# Patient Record
Sex: Male | Born: 1970
Health system: Southern US, Community
[De-identification: ages and names within clinical notes are randomized; demographics above are authoritative.]

## PROBLEM LIST (undated history)

## (undated) DIAGNOSIS — J45909 Unspecified asthma, uncomplicated: Secondary | ICD-10-CM

## (undated) DIAGNOSIS — R079 Chest pain, unspecified: Secondary | ICD-10-CM

## (undated) DIAGNOSIS — E785 Hyperlipidemia, unspecified: Secondary | ICD-10-CM

## (undated) DIAGNOSIS — I10 Essential (primary) hypertension: Secondary | ICD-10-CM

## (undated) HISTORY — PX: NO PAST SURGERIES: SHX2092

---

## 2017-11-26 DIAGNOSIS — R079 Chest pain, unspecified: Secondary | ICD-10-CM

## 2017-11-26 HISTORY — DX: Chest pain, unspecified: R07.9

## 2017-12-07 ENCOUNTER — Encounter (HOSPITAL_BASED_OUTPATIENT_CLINIC_OR_DEPARTMENT_OTHER): Payer: Self-pay | Admitting: Emergency Medicine

## 2017-12-07 ENCOUNTER — Ambulatory Visit (HOSPITAL_COMMUNITY)
Admission: EM | Disposition: A | Discharge status: Home / Self Care | Payer: Self-pay | Source: Home / Self Care | Attending: Emergency Medicine

## 2017-12-07 ENCOUNTER — Other Ambulatory Visit: Payer: Self-pay

## 2017-12-07 ENCOUNTER — Observation Stay (HOSPITAL_BASED_OUTPATIENT_CLINIC_OR_DEPARTMENT_OTHER)
Admission: EM | Admit: 2017-12-07 | Discharge: 2017-12-09 | Disposition: A | Discharge status: Home / Self Care | Payer: BLUE CROSS/BLUE SHIELD | Attending: Family Medicine | Admitting: Family Medicine

## 2017-12-07 ENCOUNTER — Emergency Department (HOSPITAL_BASED_OUTPATIENT_CLINIC_OR_DEPARTMENT_OTHER): Payer: BLUE CROSS/BLUE SHIELD

## 2017-12-07 DIAGNOSIS — Z23 Encounter for immunization: Secondary | ICD-10-CM | POA: Insufficient documentation

## 2017-12-07 DIAGNOSIS — R072 Precordial pain: Secondary | ICD-10-CM

## 2017-12-07 DIAGNOSIS — I5030 Unspecified diastolic (congestive) heart failure: Secondary | ICD-10-CM | POA: Diagnosis not present

## 2017-12-07 DIAGNOSIS — E785 Hyperlipidemia, unspecified: Secondary | ICD-10-CM | POA: Diagnosis not present

## 2017-12-07 DIAGNOSIS — Z6836 Body mass index (BMI) 36.0-36.9, adult: Secondary | ICD-10-CM | POA: Diagnosis not present

## 2017-12-07 DIAGNOSIS — I219 Acute myocardial infarction, unspecified: Secondary | ICD-10-CM | POA: Diagnosis not present

## 2017-12-07 DIAGNOSIS — R7303 Prediabetes: Secondary | ICD-10-CM | POA: Diagnosis not present

## 2017-12-07 DIAGNOSIS — I200 Unstable angina: Secondary | ICD-10-CM

## 2017-12-07 DIAGNOSIS — I420 Dilated cardiomyopathy: Secondary | ICD-10-CM | POA: Insufficient documentation

## 2017-12-07 DIAGNOSIS — R739 Hyperglycemia, unspecified: Secondary | ICD-10-CM | POA: Diagnosis not present

## 2017-12-07 DIAGNOSIS — Z8249 Family history of ischemic heart disease and other diseases of the circulatory system: Secondary | ICD-10-CM | POA: Diagnosis not present

## 2017-12-07 DIAGNOSIS — I10 Essential (primary) hypertension: Secondary | ICD-10-CM | POA: Diagnosis present

## 2017-12-07 DIAGNOSIS — E669 Obesity, unspecified: Secondary | ICD-10-CM | POA: Insufficient documentation

## 2017-12-07 DIAGNOSIS — I472 Ventricular tachycardia: Secondary | ICD-10-CM | POA: Diagnosis not present

## 2017-12-07 DIAGNOSIS — I214 Non-ST elevation (NSTEMI) myocardial infarction: Secondary | ICD-10-CM | POA: Diagnosis not present

## 2017-12-07 DIAGNOSIS — R079 Chest pain, unspecified: Secondary | ICD-10-CM | POA: Diagnosis present

## 2017-12-07 DIAGNOSIS — I2510 Atherosclerotic heart disease of native coronary artery without angina pectoris: Secondary | ICD-10-CM | POA: Diagnosis not present

## 2017-12-07 DIAGNOSIS — J45909 Unspecified asthma, uncomplicated: Secondary | ICD-10-CM | POA: Insufficient documentation

## 2017-12-07 DIAGNOSIS — Z79899 Other long term (current) drug therapy: Secondary | ICD-10-CM | POA: Insufficient documentation

## 2017-12-07 DIAGNOSIS — I110 Hypertensive heart disease with heart failure: Secondary | ICD-10-CM | POA: Diagnosis not present

## 2017-12-07 HISTORY — DX: Chest pain, unspecified: R07.9

## 2017-12-07 HISTORY — DX: Essential (primary) hypertension: I10

## 2017-12-07 HISTORY — PX: LEFT HEART CATH AND CORONARY ANGIOGRAPHY: CATH118249

## 2017-12-07 HISTORY — DX: Unspecified asthma, uncomplicated: J45.909

## 2017-12-07 HISTORY — DX: Hyperlipidemia, unspecified: E78.5

## 2017-12-07 LAB — CBC
HEMATOCRIT: 47.8 % (ref 39.0–52.0)
HEMOGLOBIN: 15.1 g/dL (ref 13.0–17.0)
MCH: 26.5 pg (ref 26.0–34.0)
MCHC: 31.6 g/dL (ref 30.0–36.0)
MCV: 84 fL (ref 78.0–100.0)
Platelets: 169 10*3/uL (ref 150–400)
RBC: 5.69 MIL/uL (ref 4.22–5.81)
RDW: 15.1 % (ref 11.5–15.5)
WBC: 5.9 10*3/uL (ref 4.0–10.5)

## 2017-12-07 LAB — CBC WITH DIFFERENTIAL/PLATELET
BASOS ABS: 0 10*3/uL (ref 0.0–0.1)
BASOS PCT: 0 %
EOS ABS: 0.1 10*3/uL (ref 0.0–0.7)
EOS PCT: 3 %
HCT: 47.2 % (ref 39.0–52.0)
Hemoglobin: 15.6 g/dL (ref 13.0–17.0)
LYMPHS ABS: 1.1 10*3/uL (ref 0.7–4.0)
Lymphocytes Relative: 28 %
MCH: 26.7 pg (ref 26.0–34.0)
MCHC: 33.1 g/dL (ref 30.0–36.0)
MCV: 80.8 fL (ref 78.0–100.0)
Monocytes Absolute: 0.2 10*3/uL (ref 0.1–1.0)
Monocytes Relative: 6 %
NEUTROS PCT: 63 %
Neutro Abs: 2.6 10*3/uL (ref 1.7–7.7)
PLATELETS: 157 10*3/uL (ref 150–400)
RBC: 5.84 MIL/uL — AB (ref 4.22–5.81)
RDW: 15.7 % — ABNORMAL HIGH (ref 11.5–15.5)
WBC: 4.1 10*3/uL (ref 4.0–10.5)

## 2017-12-07 LAB — CREATININE, SERUM
CREATININE: 1.25 mg/dL — AB (ref 0.61–1.24)
GFR calc Af Amer: >60 mL/min (ref >60)
GFR calc non Af Amer: >60 mL/min (ref >60)

## 2017-12-07 LAB — BASIC METABOLIC PANEL
Anion gap: 11 (ref 5–15)
BUN: 15 mg/dL (ref 6–20)
CO2: 25 mmol/L (ref 22–32)
Calcium: 9.6 mg/dL (ref 8.9–10.3)
Chloride: 105 mmol/L (ref 98–111)
Creatinine, Ser: 1.35 mg/dL — ABNORMAL HIGH (ref 0.61–1.24)
GFR calc non Af Amer: >60 mL/min (ref >60)
GLUCOSE: 160 mg/dL — AB (ref 70–99)
POTASSIUM: 4 mmol/L (ref 3.5–5.1)
SODIUM: 141 mmol/L (ref 135–145)

## 2017-12-07 LAB — TSH: TSH: 0.915 u[IU]/mL (ref 0.350–4.500)

## 2017-12-07 LAB — TROPONIN I
TROPONIN I: 0.79 ng/mL — AB (ref <0.03)
TROPONIN I: 6.38 ng/mL — AB (ref <0.03)
Troponin I: <0.03 ng/mL (ref <0.03)

## 2017-12-07 LAB — HEMOGLOBIN A1C
Hgb A1c MFr Bld: 6.4 % — ABNORMAL HIGH (ref 4.8–5.6)
Mean Plasma Glucose: 136.98 mg/dL

## 2017-12-07 SURGERY — LEFT HEART CATH AND CORONARY ANGIOGRAPHY
Anesthesia: LOCAL

## 2017-12-07 MED ORDER — ASPIRIN 81 MG PO CHEW
324.0000 mg | CHEWABLE_TABLET | Freq: Once | ORAL | Status: AC
  Administered 2017-12-07: 324 mg via ORAL
  Filled 2017-12-07: qty 4

## 2017-12-07 MED ORDER — ASPIRIN 81 MG PO CHEW: 81.0000 mg | CHEWABLE_TABLET | ORAL | Status: AC

## 2017-12-07 MED ORDER — HEPARIN (PORCINE) IN NACL 100-0.45 UNIT/ML-% IJ SOLN
1100.0000 [IU]/h | INTRAMUSCULAR | Status: DC
  Administered 2017-12-07: 1100 [IU]/h via INTRAVENOUS
  Filled 2017-12-07: qty 250

## 2017-12-07 MED ORDER — VERAPAMIL HCL 2.5 MG/ML IV SOLN
INTRAVENOUS | Status: DC | PRN
  Administered 2017-12-07: 10 mL via INTRA_ARTERIAL

## 2017-12-07 MED ORDER — SODIUM CHLORIDE 0.9% FLUSH
3.0000 mL | Freq: Two times a day (BID) | INTRAVENOUS | Status: DC
  Administered 2017-12-08 – 2017-12-09 (×2): 3 mL via INTRAVENOUS

## 2017-12-07 MED ORDER — SODIUM CHLORIDE 0.9% FLUSH: 3.0000 mL | INTRAVENOUS | Status: DC | PRN

## 2017-12-07 MED ORDER — METOPROLOL TARTRATE 25 MG PO TABS
25.0000 mg | ORAL_TABLET | Freq: Two times a day (BID) | ORAL | Status: DC
  Administered 2017-12-07: 25 mg via ORAL
  Filled 2017-12-07: qty 1

## 2017-12-07 MED ORDER — MIDAZOLAM HCL 2 MG/2ML IJ SOLN
INTRAMUSCULAR | Status: DC | PRN
  Administered 2017-12-07: 1 mg via INTRAVENOUS

## 2017-12-07 MED ORDER — PROPRANOLOL HCL 10 MG PO TABS
10.0000 mg | ORAL_TABLET | ORAL | Status: DC | PRN
  Filled 2017-12-07: qty 1

## 2017-12-07 MED ORDER — IOHEXOL 350 MG/ML SOLN
INTRAVENOUS | Status: DC | PRN
  Administered 2017-12-07: 80 mL via INTRA_ARTERIAL

## 2017-12-07 MED ORDER — SODIUM CHLORIDE 0.9% FLUSH
3.0000 mL | Freq: Two times a day (BID) | INTRAVENOUS | Status: DC
  Administered 2017-12-08: 3 mL via INTRAVENOUS

## 2017-12-07 MED ORDER — MIDAZOLAM HCL 2 MG/2ML IJ SOLN
INTRAMUSCULAR | Status: AC
  Filled 2017-12-07: qty 2

## 2017-12-07 MED ORDER — ATORVASTATIN CALCIUM 40 MG PO TABS
40.0000 mg | ORAL_TABLET | Freq: Every day | ORAL | Status: DC
  Administered 2017-12-07: 40 mg via ORAL
  Filled 2017-12-07: qty 1

## 2017-12-07 MED ORDER — FENTANYL CITRATE (PF) 100 MCG/2ML IJ SOLN
INTRAMUSCULAR | Status: DC | PRN
  Administered 2017-12-07: 50 ug via INTRAVENOUS

## 2017-12-07 MED ORDER — HEPARIN (PORCINE) IN NACL 1000-0.9 UT/500ML-% IV SOLN
INTRAVENOUS | Status: AC
  Filled 2017-12-07: qty 1000

## 2017-12-07 MED ORDER — LIDOCAINE HCL (PF) 1 % IJ SOLN
INTRAMUSCULAR | Status: DC | PRN
  Administered 2017-12-07: 2 mL

## 2017-12-07 MED ORDER — ONDANSETRON HCL 4 MG/2ML IJ SOLN: 4.0000 mg | Freq: Four times a day (QID) | INTRAMUSCULAR | Status: DC | PRN

## 2017-12-07 MED ORDER — MORPHINE SULFATE (PF) 2 MG/ML IV SOLN: 2.0000 mg | INTRAVENOUS | Status: DC | PRN

## 2017-12-07 MED ORDER — ACETAMINOPHEN 325 MG PO TABS
650.0000 mg | ORAL_TABLET | ORAL | Status: DC | PRN
  Administered 2017-12-07 – 2017-12-09 (×2): 650 mg via ORAL
  Filled 2017-12-07 (×2): qty 2

## 2017-12-07 MED ORDER — NITROGLYCERIN 0.4 MG SL SUBL
0.4000 mg | SUBLINGUAL_TABLET | SUBLINGUAL | Status: AC | PRN
  Administered 2017-12-07 (×3): 0.4 mg via SUBLINGUAL
  Filled 2017-12-07: qty 1

## 2017-12-07 MED ORDER — FENTANYL CITRATE (PF) 100 MCG/2ML IJ SOLN
INTRAMUSCULAR | Status: AC
  Filled 2017-12-07: qty 2

## 2017-12-07 MED ORDER — SODIUM CHLORIDE 0.9 % IV SOLN: 250.0000 mL | INTRAVENOUS | Status: DC | PRN

## 2017-12-07 MED ORDER — LISINOPRIL 40 MG PO TABS
40.0000 mg | ORAL_TABLET | Freq: Every day | ORAL | Status: DC
  Filled 2017-12-07: qty 1

## 2017-12-07 MED ORDER — SODIUM CHLORIDE 0.9 % IV SOLN: INTRAVENOUS | Status: DC

## 2017-12-07 MED ORDER — GI COCKTAIL ~~LOC~~: 30.0000 mL | Freq: Four times a day (QID) | ORAL | Status: DC | PRN

## 2017-12-07 MED ORDER — HEPARIN SODIUM (PORCINE) 5000 UNIT/ML IJ SOLN
5000.0000 [IU] | Freq: Three times a day (TID) | INTRAMUSCULAR | Status: DC
  Administered 2017-12-07 – 2017-12-09 (×5): 5000 [IU] via SUBCUTANEOUS
  Filled 2017-12-07 (×5): qty 1

## 2017-12-07 MED ORDER — HEPARIN (PORCINE) IN NACL 1000-0.9 UT/500ML-% IV SOLN
INTRAVENOUS | Status: DC | PRN
  Administered 2017-12-07 (×2): 500 mL

## 2017-12-07 MED ORDER — VERAPAMIL HCL 2.5 MG/ML IV SOLN
INTRAVENOUS | Status: AC
  Filled 2017-12-07: qty 2

## 2017-12-07 MED ORDER — LIDOCAINE HCL (PF) 1 % IJ SOLN
INTRAMUSCULAR | Status: AC
  Filled 2017-12-07: qty 30

## 2017-12-07 MED ORDER — ENOXAPARIN SODIUM 40 MG/0.4ML ~~LOC~~ SOLN
40.0000 mg | SUBCUTANEOUS | Status: DC
  Administered 2017-12-07: 40 mg via SUBCUTANEOUS
  Filled 2017-12-07: qty 0.4

## 2017-12-07 MED ORDER — SODIUM CHLORIDE 0.9 % IV SOLN
INTRAVENOUS | Status: AC | PRN
  Administered 2017-12-07: 75 mL/h via INTRAVENOUS

## 2017-12-07 MED ORDER — ASPIRIN EC 81 MG PO TBEC
81.0000 mg | DELAYED_RELEASE_TABLET | Freq: Every day | ORAL | Status: DC
  Administered 2017-12-07 – 2017-12-08 (×2): 81 mg via ORAL
  Filled 2017-12-07 (×2): qty 1

## 2017-12-07 MED ORDER — INFLUENZA VAC SPLIT QUAD 0.5 ML IM SUSY
0.5000 mL | PREFILLED_SYRINGE | INTRAMUSCULAR | Status: AC
  Administered 2017-12-09: 0.5 mL via INTRAMUSCULAR
  Filled 2017-12-07: qty 0.5

## 2017-12-07 MED ORDER — AMLODIPINE BESYLATE 10 MG PO TABS
10.0000 mg | ORAL_TABLET | Freq: Every day | ORAL | Status: DC
  Administered 2017-12-07 – 2017-12-09 (×3): 10 mg via ORAL
  Filled 2017-12-07 (×3): qty 1

## 2017-12-07 MED ORDER — HEPARIN SODIUM (PORCINE) 1000 UNIT/ML IJ SOLN
INTRAMUSCULAR | Status: AC
  Filled 2017-12-07: qty 1

## 2017-12-07 MED ORDER — FUROSEMIDE 10 MG/ML IJ SOLN
20.0000 mg | Freq: Every day | INTRAMUSCULAR | Status: DC
  Administered 2017-12-07 – 2017-12-08 (×2): 20 mg via INTRAVENOUS
  Filled 2017-12-07 (×2): qty 2

## 2017-12-07 SURGICAL SUPPLY — 10 items

## 2017-12-07 NOTE — ED Notes (Signed)
Report given to [facility] RN, accepting nurse at [facility]

## 2017-12-07 NOTE — Interval H&P Note (Signed)
History and Physical Interval Note:  12/07/2017 4:23 PM  Jeffrey Moreno  has presented today for cardiac catheterization, with the diagnosis of NSTEMI. The various methods of treatment have been discussed with the patient and family. After consideration of risks, benefits and other options for treatment, the patient has consented to  Procedure(s): LEFT HEART CATH AND CORONARY ANGIOGRAPHY (N/A) as a surgical intervention .  The patient's history has been reviewed, patient examined, no change in status, stable for surgery.  I have reviewed the patient's chart and labs.  Questions were answered to the patient's satisfaction.    Cath Lab Visit (complete for each Cath Lab visit)  Clinical Evaluation Leading to the Procedure:   ACS: Yes.    Non-ACS:  N/A  Jerik Falletta

## 2017-12-07 NOTE — ED Triage Notes (Signed)
Pt reports dull chest pain in center of chest that started at 0600 while eating breakfast. Pt also endorses shob.

## 2017-12-07 NOTE — Progress Notes (Signed)
ANTICOAGULATION CONSULT NOTE - Initial Consult  Pharmacy Consult for Heparin Indication: ACS/Chest pain  No Known Allergies  Patient Measurements: Height: 5\' 8"  (172.7 cm) Weight: 240 lb (108.9 kg) IBW/kg (Calculated) : 68.4 Heparin Dosing Weight: 92.5  Vital Signs: Temp: 99.1 F (37.3 C) (09/12 1400) Temp Source: Oral (09/12 1400) BP: 149/105 (09/12 1400) Pulse Rate: 77 (09/12 1400)  Labs: Recent Labs    12/07/17 0646 12/07/17 1216  HGB 15.6  --   HCT 47.2  --   PLT 157  --   CREATININE 1.35*  --   TROPONINI <0.03 0.79*    Estimated Creatinine Clearance: 80.1 mL/min (A) (by C-G formula based on SCr of 1.35 mg/dL (H)).   Medical History: Past Medical History:  Diagnosis Date  . Asthma   . Chest pain at rest 11/2017  . Hyperlipidemia   . Hypertension    Assessment: 56 y male with PMH of HTN, HLD, and obesity who presents with chest pain. Patient has exhibited a rise in troponin and therefore is being started on heparin for NSTEMI. Patient di receive one dose of enoxaparin 40mg  at ~1245 and therefore will not be bolused.   Goal of Therapy:  Heparin level 0.3-0.7 units/ml Monitor platelets by anticoagulation protocol: Yes   Plan:  Heparin drip 1100 units/hr Heparin level in 6 hours Daily Heparin level and CBC Monitor for s/sx of bleeding.   Loisann Roach A. Jeanella Craze, PharmD, BCPS Clinical Pharmacist [place] Pager: [phone] Please utilize Amion for appropriate phone number to reach the unit pharmacist [facility])   12/07/2017,3:00 PM

## 2017-12-07 NOTE — Progress Notes (Addendum)
Patient's troponin is positive at 0.79, up from <0.03.  This appears to be an NSTEMI.  Will start Heparin.  Cardiology has been consulted to see the patient today rather than waiting until tomorrow.  He did also have a 20-beat run of asymptomatic vtach while sleeping.  Georgana Curio, M.D.

## 2017-12-07 NOTE — Progress Notes (Signed)
Attending MD informed of positive troponin.

## 2017-12-07 NOTE — H&P (View-Only) (Signed)
Cardiology Consultation:   Patient ID: Jeffrey Moreno MRN: [id number]; DOB: 05-16-1970  Admit date: 12/07/2017 Date of Consult: 12/07/2017  Primary Care Provider: Deatra James, MD Primary Cardiologist: Jodelle Red, MD NEW Primary Electrophysiologist:  None    Patient Profile:   Jeffrey Moreno is a 47 y.o. male with a hx of asthma, HLD and HTN who is being seen today for the evaluation of chest pain at the request of Dr. Ophelia Charter.  History of Present Illness:   Jeffrey Moreno presented to ER at [facility] yesterday AM with chest pain pain began yesterday AM about 1 hour prior to presentation.  Started with eating BK and felt like a pressure in middle of his chest.  With exertion he was diaphoretic.  He had mild SOB he tried inhaler.  He was also dizzy.  In ER given NTG and it seemed to  Help- total of 3.    [facility] and now troponin elevated to 0.79 Iv Heparin was started.    EKG SR with ST abnormalities in Lat leads, no old to compare.  I personally reviewed Tele:  SR with NSVT up to 20 beats, I personally reviewed.    Troponin I <0.03  And now 0.79  Na 141, K+ 4.0, glucose 160, Cr 1.35 Hgb 15.6 plts 157 Hgb A1c 6.4  TSH 0.915  CXR heart borderline size no acute process.  Currently pain free, just tired.  He did have 19 beats of NSVT and then 6 more beats.  Pt was not aware.  RN stated he had pulse with the NSVT.   BP elevated 149/105 , temp 99.1  He just had lunch at 1230.   He had 40 mg lovenox at 1249 and now has order for IV heparin.    He has rec'd ASA 324, and 81 mg.  lipitor 40 mg and amlodipine 10 - his lisinopril on hold.  With mildly elevated Cr.  No prior hx of chest pain.    Past Medical History:  Diagnosis Date  . Asthma   . Chest pain at rest 11/2017  . Hyperlipidemia   . Hypertension     Past Surgical History:  Procedure Laterality Date  . NO PAST SURGERIES       Home Medications:  Prior to Admission medications     Medication Sig Start Date End Date Taking? Authorizing Provider  albuterol (PROVENTIL HFA;VENTOLIN HFA) 108 (90 Base) MCG/ACT inhaler Inhale 2 puffs into the lungs every 6 (six) hours as needed. 10/18/17  Yes [provider]  amLODipine (NORVASC) 10 MG tablet Take 10 mg by mouth daily.   Yes [provider]  atorvastatin (LIPITOR) 20 MG tablet Take 20 mg by mouth daily.   Yes [provider]  ibuprofen (ADVIL,MOTRIN) 200 MG tablet Take 600 mg by mouth as needed for moderate pain.   Yes [provider]  lisinopril (PRINIVIL,ZESTRIL) 40 MG tablet Take 40 mg by mouth daily.    Yes [provider]  propranolol (INDERAL) 10 MG tablet Take 10 mg by mouth as needed (anxiety).   Yes [provider]    Inpatient Medications: Scheduled Meds: . amLODipine  10 mg Oral Daily  . aspirin EC  81 mg Oral Daily  . atorvastatin  40 mg Oral Daily  . [START ON 12/08/2017] Influenza vac split quadrivalent PF  0.5 mL Intramuscular Tomorrow-1000  . lisinopril  40 mg Oral Daily   Continuous Infusions: . heparin     PRN  Meds: acetaminophen, gi cocktail, morphine injection, ondansetron (ZOFRAN) IV, propranolol  Allergies:   No Known Allergies  Social History:   Social History   Socioeconomic History  . Marital status: Single    Spouse name: Not on file  . Number of children: Not on file  . Years of education: Not on file  . Highest education level: Not on file  Occupational History  . Occupation: Environmental manager  Social Needs  . Financial resource strain: Not on file  . Food insecurity:    Worry: Not on file    Inability: Not on file  . Transportation needs:    Medical: Not on file    Non-medical: Not on file  Tobacco Use  . Smoking status: Never Smoker  . Smokeless tobacco: Never Used  Substance and Sexual Activity  . Alcohol use: Yes    Comment: SOCIAL  . Drug use: Never  . Sexual activity: Not on file  Lifestyle  . Physical activity:     Days per week: Not on file    Minutes per session: Not on file  . Stress: Not on file  Relationships  . Social connections:    Talks on phone: Not on file    Gets together: Not on file    Attends religious service: Not on file    Active member of club or organization: Not on file    Attends meetings of clubs or organizations: Not on file    Relationship status: Not on file  . Intimate partner violence:    Fear of current or ex partner: Not on file    Emotionally abused: Not on file    Physically abused: Not on file    Forced sexual activity: Not on file  Other Topics Concern  . Not on file  Social History Narrative  . Not on file    Family History:    Family History  Problem Relation Age of Onset  . Aneurysm Mother 20  . Hypertension Father   . CAD Maternal Grandmother      ROS:  Please see the history of present illness.  General:no colds or fevers, no weight changes Skin:no rashes or ulcers HEENT:no blurred vision, no congestion CV:see HPI PUL:see HPI, + asthma GI:no diarrhea constipation or melena, no indigestion GU:no hematuria, no dysuria MS:no joint pain, no claudication Neuro:no syncope, no lightheadedness Endo:no diabetes, no thyroid disease though borderline diabetes  All other ROS reviewed and negative.     Physical Exam/Data:   Vitals:   12/07/17 0900 12/07/17 0930 12/07/17 1100 12/07/17 1400  BP: (!) 154/110 121/87 (!) 165/108 (!) 149/105  Pulse: 70 85 74 77  Resp: (!) 26 (!) 24 18   Temp:   98.2 F (36.8 C) 99.1 F (37.3 C)  TempSrc:   Oral Oral  SpO2: 98% 100% 98% 100%  Weight:      Height:       No intake or output data in the 24 hours ending 12/07/17 1538 Filed Weights   12/07/17 0641  Weight: 108.9 kg   Body mass index is 36.49 kg/m.  General:  Well nourished, well developed, in no acute distress HEENT: normal Lymph: no adenopathy Neck: no JVD Endocrine:  No thryomegaly Vascular: No carotid bruits; pedal pulses 2+ bilaterally     Cardiac:  normal S1, S2; RRR; no murmur gallup rub or click Lungs:  clear to auscultation bilaterally, no wheezing, rhonchi or rales  Abd: soft, nontender, no hepatomegaly  Ext: no edema Musculoskeletal:  No  deformities, BUE and BLE strength normal and equal Skin: warm and dry  Neuro:  Alert and oriented X 3 MAE follows commands, no focal abnormalities noted Psych:  Normal affect     Relevant CV Studies: none  Laboratory Data:  Chemistry Recent Labs  Lab 12/07/17 0646  NA 141  K 4.0  CL 105  CO2 25  GLUCOSE 160*  BUN 15  CREATININE 1.35*  CALCIUM 9.6  GFRNONAA >60  GFRAA >60  ANIONGAP 11    No results for input(s): PROT, ALBUMIN, AST, ALT, ALKPHOS, BILITOT in the last 168 hours. Hematology Recent Labs  Lab 12/07/17 0646  WBC 4.1  RBC 5.84*  HGB 15.6  HCT 47.2  MCV 80.8  MCH 26.7  MCHC 33.1  RDW 15.7*  PLT 157   Cardiac Enzymes Recent Labs  Lab 12/07/17 0646 12/07/17 1216  TROPONINI <0.03 0.79*   No results for input(s): TROPIPOC in the last 168 hours.  BNPNo results for input(s): BNP, PROBNP in the last 168 hours.  DDimer No results for input(s): DDIMER in the last 168 hours.  Radiology/Studies:  Dg Chest 2 View  Result Date: 12/07/2017 CLINICAL DATA:  Dull chest pain. EXAM: CHEST - 2 VIEW COMPARISON:  None. FINDINGS: Heart is borderline in size. Lungs clear. No effusions. No acute bony abnormality. IMPRESSION: Borderline heart size.  No active disease. Electronically Signed   By: Charlett Nose M.D.   On: 12/07/2017 07:29    Assessment and Plan:   1. NSTEMI with chest pain, elevated troponin, abnormal EKG and NSVT currently pain free, IV heparin to be added.  Dr. Cristal Deer to see.  Probable cath in AM though may be able to arrange for today.  Will add BB but monitor for wheezes with hx of asthma.  Hold lisinopril for cardiac cath.   Pt with HTN , HLD and borderline diabetes.  We can do heart cath today so will plan for this.   2. Asthma monitor  with BB 3. HTN uncontrolled currently will add hydralazine if Dr. Cristal Deer agrees.  4. HLD on Lipitor at home will increase dose 80 mg     5. Borderline diabetic.       For questions or updates, please contact [facility] Please consult [email or web address] for contact info under     Signed, Nada Boozer, NP  12/07/2017 3:38 PM

## 2017-12-07 NOTE — Progress Notes (Signed)
Called with a request to transfer this patient from [facility] to [facility].  Patient is a 48yo male with h/o HTN, HLD, obesity, and asthma presenting with chest pain starting at 0600.  +diaphoresis, dizziness.  EKG with fascicular block, no ST elevations.  Troponin negative at 1 hour.  Given concern for high-risk chest pain, would like to observe and r/o for MI.  Will place an order for Obs/Tele.  Georgana Curio, M.D.

## 2017-12-07 NOTE — ED Provider Notes (Signed)
[facility] EMERGENCY DEPARTMENT Provider Note   CSN: [id number] Arrival date & time: 12/07/17  7356     History   Chief Complaint Chief Complaint  Patient presents with  . Chest Pain    HPI Jeffrey Moreno is a 47 y.o. male.  HPI  47 year old male with a history of asthma, hypertension, hypercholesterolemia presents with chest pain.  Started around 6 AM while he was eating breakfast.  Feels like a pressure in the middle of his chest.  Does not radiate.  He got up to go to the bedroom after a couple minutes and broke out into a sweat.  Eventually in his bedroom he started to have dizziness.  Felt like the room was spinning.  There was also some shortness of breath and he tried albuterol because he thought it might be asthma.  No back pain or abdominal pain.  No nausea/vomiting.  Pain was originally around a 7 out of 10 but now is about a 3 out of 10.  No leg swelling/pain.  No smoking.  Past Medical History:  Diagnosis Date  . Asthma   . Hypertension     Patient Active Problem List   Diagnosis Date Noted  . Chest pain 12/07/2017    History reviewed. No pertinent surgical history.      Home Medications    Prior to Admission medications   Medication Sig Start Date End Date Taking? Authorizing Provider  amLODipine (NORVASC) 10 MG tablet Take 10 mg by mouth daily.   Yes [provider]  atorvastatin (LIPITOR) 20 MG tablet Take 20 mg by mouth daily.   Yes [provider]  lisinopril (PRINIVIL,ZESTRIL) 20 MG tablet Take 40 mg by mouth daily.    Yes [provider]  propranolol (INDERAL) 10 MG tablet Take 10 mg by mouth as needed (anxiety).   Yes [provider]    Family History No family history on file.  Social History Social History   Tobacco Use  . Smoking status: Never Smoker  . Smokeless tobacco: Never Used  Substance Use Topics  . Alcohol use: Yes  . Drug use: Never     Allergies   Patient has no known  allergies.   Review of Systems Review of Systems  Constitutional: Positive for diaphoresis.  Respiratory: Positive for shortness of breath.   Cardiovascular: Positive for chest pain. Negative for leg swelling.  Gastrointestinal: Negative for abdominal pain, nausea and vomiting.  All other systems reviewed and are negative.    Physical Exam Updated Vital Signs BP (!) 147/112   Pulse 89   Temp 97.7 F (36.5 C) (Oral)   Resp (!) 21   Ht 5\' 8"  (1.727 m)   Wt 108.9 kg   SpO2 96%   BMI 36.49 kg/m   Physical Exam  Constitutional: He is oriented to person, place, and time. He appears well-developed and well-nourished.  Non-toxic appearance. He does not appear ill. No distress.  HENT:  Head: Normocephalic and atraumatic.  Right Ear: External ear normal.  Left Ear: External ear normal.  Nose: Nose normal.  Eyes: Right eye exhibits no discharge. Left eye exhibits no discharge.  Neck: Neck supple.  Cardiovascular: Normal rate, regular rhythm and normal heart sounds.  Pulses:      Radial pulses are 2+ on the right side, and 2+ on the left side.  Pulmonary/Chest: Effort normal and breath sounds normal.  Abdominal: Soft. There is no tenderness.  Musculoskeletal: He exhibits no edema.  Neurological: He  is alert and oriented to person, place, and time.  Skin: Skin is warm and dry. He is not diaphoretic.  Nursing note and vitals reviewed.    ED Treatments / Results  Labs (all labs ordered are listed, but only abnormal results are displayed) Labs Reviewed  CBC WITH DIFFERENTIAL/PLATELET - Abnormal; Notable for the following components:      Result Value   RBC 5.84 (*)    RDW 15.7 (*)    All other components within normal limits  BASIC METABOLIC PANEL - Abnormal; Notable for the following components:   Glucose, Bld 160 (*)    Creatinine, Ser 1.35 (*)    All other components within normal limits  TROPONIN I    EKG EKG Interpretation  Date/Time:  Thursday December 07 2017  06:39:10 EDT Ventricular Rate:  93 PR Interval:    QRS Duration: 83 QT Interval:  338 QTC Calculation: 421 R Axis:   -70 Text Interpretation:  Sinus rhythm Probable left atrial enlargement LAD, consider left anterior fascicular block Confirmed by Palumbo, April ([id number]) on 12/07/2017 6:43:23 AM   EKG Interpretation  Date/Time:  Thursday December 07 2017 07:56:40 EDT Ventricular Rate:  95 PR Interval:    QRS Duration: 82 QT Interval:  352 QTC Calculation: 443 R Axis:   -51 Text Interpretation:  Sinus rhythm Probable left atrial enlargement Left axis deviation Nonspecific T abnormalities, lateral leads no significant change since earlier in the day Confirmed by Pricilla Loveless [phone]) on 12/07/2017 8:02:03 AM        Radiology Dg Chest 2 View  Result Date: 12/07/2017 CLINICAL DATA:  Dull chest pain. EXAM: CHEST - 2 VIEW COMPARISON:  None. FINDINGS: Heart is borderline in size. Lungs clear. No effusions. No acute bony abnormality. IMPRESSION: Borderline heart size.  No active disease. Electronically Signed   By: Charlett Nose M.D.   On: 12/07/2017 07:29    Procedures Procedures (including critical care time)  Medications Ordered in ED Medications  aspirin chewable tablet 324 mg (324 mg Oral Given 12/07/17 0718)  nitroGLYCERIN (NITROSTAT) SL tablet 0.4 mg (0.4 mg Sublingual Given 12/07/17 0733)     Initial Impression / Assessment and Plan / ED Course  I have reviewed the triage vital signs and the nursing notes.  Pertinent labs & imaging results that were available during my care of the patient were reviewed by me and considered in my medical decision making (see chart for details).     Patient's chest pain is concerning and he has some risk factors for cardiac disease.  His HEART score is a 4.  He has nonspecific T wave changes but no old to compare to on his ECG.  He does have good relief with nitroglycerin and aspirin.  At this point, I think he will need close work-up and thus  I will admit him to the hospital service.  Dr. Ophelia Charter accepts and transfer and admission to [facility].  Final Clinical Impressions(s) / ED Diagnoses   Final diagnoses:  Chest pain at rest    ED Discharge Orders    None       Pricilla Loveless, MD 12/07/17 [phone]

## 2017-12-07 NOTE — Consult Note (Signed)
Cardiology Consultation:   Patient ID: Jeffrey Moreno MRN: [id number]; DOB: 08/28/1970  Admit date: 12/07/2017 Date of Consult: 12/07/2017  Primary Care Provider: Sun, Vyvyan, MD Primary Cardiologist: Bridgette Christopher, MD NEW Primary Electrophysiologist:  None    Patient Profile:   Jeffrey Moreno is a 47 y.o. male with a hx of asthma, HLD and HTN who is being seen today for the evaluation of chest pain at the request of Dr. Yates.  History of Present Illness:   Jeffrey Moreno presented to ER at [facility] yesterday AM with chest pain pain began yesterday AM about 1 hour prior to presentation.  Started with eating BK and felt like a pressure in middle of his chest.  With exertion he was diaphoretic.  He had mild SOB he tried inhaler.  He was also dizzy.  In ER given NTG and it seemed to  Help- total of 3.    [facility] and now troponin elevated to 0.79 Iv Heparin was started.    EKG SR with ST abnormalities in Lat leads, no old to compare.  I personally reviewed Tele:  SR with NSVT up to 20 beats, I personally reviewed.    Troponin I <0.03  And now 0.79  Na 141, K+ 4.0, glucose 160, Cr 1.35 Hgb 15.6 plts 157 Hgb A1c 6.4  TSH 0.915  CXR heart borderline size no acute process.  Currently pain free, just tired.  He did have 19 beats of NSVT and then 6 more beats.  Pt was not aware.  RN stated he had pulse with the NSVT.   BP elevated 149/105 , temp 99.1  He just had lunch at 1230.   He had 40 mg lovenox at 1249 and now has order for IV heparin.    He has rec'd ASA 324, and 81 mg.  lipitor 40 mg and amlodipine 10 - his lisinopril on hold.  With mildly elevated Cr.  No prior hx of chest pain.    Past Medical History:  Diagnosis Date  . Asthma   . Chest pain at rest 11/2017  . Hyperlipidemia   . Hypertension     Past Surgical History:  Procedure Laterality Date  . NO PAST SURGERIES       Home Medications:  Prior to Admission medications     Medication Sig Start Date End Date Taking? Authorizing Provider  albuterol (PROVENTIL HFA;VENTOLIN HFA) 108 (90 Base) MCG/ACT inhaler Inhale 2 puffs into the lungs every 6 (six) hours as needed. 10/18/17  Yes [provider]  amLODipine (NORVASC) 10 MG tablet Take 10 mg by mouth daily.   Yes [provider]  atorvastatin (LIPITOR) 20 MG tablet Take 20 mg by mouth daily.   Yes [provider]  ibuprofen (ADVIL,MOTRIN) 200 MG tablet Take 600 mg by mouth as needed for moderate pain.   Yes [provider]  lisinopril (PRINIVIL,ZESTRIL) 40 MG tablet Take 40 mg by mouth daily.    Yes [provider]  propranolol (INDERAL) 10 MG tablet Take 10 mg by mouth as needed (anxiety).   Yes [provider]    Inpatient Medications: Scheduled Meds: . amLODipine  10 mg Oral Daily  . aspirin EC  81 mg Oral Daily  . atorvastatin  40 mg Oral Daily  . [START ON 12/08/2017] Influenza vac split quadrivalent PF  0.5 mL Intramuscular Tomorrow-1000  . lisinopril  40 mg Oral Daily   Continuous Infusions: . heparin     PRN   Meds: acetaminophen, gi cocktail, morphine injection, ondansetron (ZOFRAN) IV, propranolol  Allergies:   No Known Allergies  Social History:   Social History   Socioeconomic History  . Marital status: Single    Spouse name: Not on file  . Number of children: Not on file  . Years of education: Not on file  . Highest education level: Not on file  Occupational History  . Occupation: photographer  Social Needs  . Financial resource strain: Not on file  . Food insecurity:    Worry: Not on file    Inability: Not on file  . Transportation needs:    Medical: Not on file    Non-medical: Not on file  Tobacco Use  . Smoking status: Never Smoker  . Smokeless tobacco: Never Used  Substance and Sexual Activity  . Alcohol use: Yes    Comment: SOCIAL  . Drug use: Never  . Sexual activity: Not on file  Lifestyle  . Physical activity:     Days per week: Not on file    Minutes per session: Not on file  . Stress: Not on file  Relationships  . Social connections:    Talks on phone: Not on file    Gets together: Not on file    Attends religious service: Not on file    Active member of club or organization: Not on file    Attends meetings of clubs or organizations: Not on file    Relationship status: Not on file  . Intimate partner violence:    Fear of current or ex partner: Not on file    Emotionally abused: Not on file    Physically abused: Not on file    Forced sexual activity: Not on file  Other Topics Concern  . Not on file  Social History Narrative  . Not on file    Family History:    Family History  Problem Relation Age of Onset  . Aneurysm Mother 60  . Hypertension Father   . CAD Maternal Grandmother      ROS:  Please see the history of present illness.  General:no colds or fevers, no weight changes Skin:no rashes or ulcers HEENT:no blurred vision, no congestion CV:see HPI PUL:see HPI, + asthma GI:no diarrhea constipation or melena, no indigestion GU:no hematuria, no dysuria MS:no joint pain, no claudication Neuro:no syncope, no lightheadedness Endo:no diabetes, no thyroid disease though borderline diabetes  All other ROS reviewed and negative.     Physical Exam/Data:   Vitals:   12/07/17 0900 12/07/17 0930 12/07/17 1100 12/07/17 1400  BP: (!) 154/110 121/87 (!) 165/108 (!) 149/105  Pulse: 70 85 74 77  Resp: (!) 26 (!) 24 18   Temp:   98.2 F (36.8 C) 99.1 F (37.3 C)  TempSrc:   Oral Oral  SpO2: 98% 100% 98% 100%  Weight:      Height:       No intake or output data in the 24 hours ending 12/07/17 1538 Filed Weights   12/07/17 0641  Weight: 108.9 kg   Body mass index is 36.49 kg/m.  General:  Well nourished, well developed, in no acute distress HEENT: normal Lymph: no adenopathy Neck: no JVD Endocrine:  No thryomegaly Vascular: No carotid bruits; pedal pulses 2+ bilaterally     Cardiac:  normal S1, S2; RRR; no murmur gallup rub or click Lungs:  clear to auscultation bilaterally, no wheezing, rhonchi or rales  Abd: soft, nontender, no hepatomegaly  Ext: no edema Musculoskeletal:  No   deformities, BUE and BLE strength normal and equal Skin: warm and dry  Neuro:  Alert and oriented X 3 MAE follows commands, no focal abnormalities noted Psych:  Normal affect     Relevant CV Studies: none  Laboratory Data:  Chemistry Recent Labs  Lab 12/07/17 0646  NA 141  K 4.0  CL 105  CO2 25  GLUCOSE 160*  BUN 15  CREATININE 1.35*  CALCIUM 9.6  GFRNONAA >60  GFRAA >60  ANIONGAP 11    No results for input(s): PROT, ALBUMIN, AST, ALT, ALKPHOS, BILITOT in the last 168 hours. Hematology Recent Labs  Lab 12/07/17 0646  WBC 4.1  RBC 5.84*  HGB 15.6  HCT 47.2  MCV 80.8  MCH 26.7  MCHC 33.1  RDW 15.7*  PLT 157   Cardiac Enzymes Recent Labs  Lab 12/07/17 0646 12/07/17 1216  TROPONINI <0.03 0.79*   No results for input(s): TROPIPOC in the last 168 hours.  BNPNo results for input(s): BNP, PROBNP in the last 168 hours.  DDimer No results for input(s): DDIMER in the last 168 hours.  Radiology/Studies:  Dg Chest 2 View  Result Date: 12/07/2017 CLINICAL DATA:  Dull chest pain. EXAM: CHEST - 2 VIEW COMPARISON:  None. FINDINGS: Heart is borderline in size. Lungs clear. No effusions. No acute bony abnormality. IMPRESSION: Borderline heart size.  No active disease. Electronically Signed   By: Kevin  Dover M.D.   On: 12/07/2017 07:29    Assessment and Plan:   1. NSTEMI with chest pain, elevated troponin, abnormal EKG and NSVT currently pain free, IV heparin to be added.  Dr. Christopher to see.  Probable cath in AM though may be able to arrange for today.  Will add BB but monitor for wheezes with hx of asthma.  Hold lisinopril for cardiac cath.   Pt with HTN , HLD and borderline diabetes.  We can do heart cath today so will plan for this.   2. Asthma monitor  with BB 3. HTN uncontrolled currently will add hydralazine if Dr. Christopher agrees.  4. HLD on Lipitor at home will increase dose 80 mg     5. Borderline diabetic.       For questions or updates, please contact [facility] Please consult [email or web address] for contact info under     Signed, Wolf Boulay, NP  12/07/2017 3:38 PM  

## 2017-12-07 NOTE — H&P (Signed)
History and Physical    Jeffrey Moreno ZOX:[id number] DOB: 12-Aug-1970 DOA: 12/07/2017  PCP: Deatra James, MD Consultants:  None Patient coming from: Home - lives with Daughter; NOKDorcas Mcmurray, [phone]  Chief Complaint: chest pain  HPI: Jeffrey Moreno is a 47 y.o. male with medical history significant of HTN; HLD; obesity; and asthma presenting with chest pain.   This morning, he was eating breakfast (waffle, milk).  He developed chest pain.  He laid down and it got worse.  He felt dizzy and started sweating with clammy hands.  He took a shower and continued to feel bad and then came to the ER.  Denies h/o chest pain.  No prior stress test.  Symptoms resolved completely with NTG.  He is not having any pain at this time.  Substernal chest pain, but not exertional.  He was transferred from [facility] for further evaluation.   ED Course:  Chest pain starting at 0600.  +diaphoresis, dizziness.  EKG with fascicular block, no ST elevations.  Troponin negative at 1 hour.  Given concern for high-risk chest pain, would like to observe and r/o for MI.    Review of Systems: As per HPI; otherwise review of systems reviewed and negative.   Ambulatory Status:  Ambulates without assistance  Past Medical History:  Diagnosis Date  . Asthma   . Chest pain at rest 11/2017  . Hyperlipidemia   . Hypertension     Past Surgical History:  Procedure Laterality Date  . NO PAST SURGERIES      Social History   Socioeconomic History  . Marital status: Single    Spouse name: Not on file  . Number of children: Not on file  . Years of education: Not on file  . Highest education level: Not on file  Occupational History  . Occupation: Environmental manager  Social Needs  . Financial resource strain: Not on file  . Food insecurity:    Worry: Not on file    Inability: Not on file  . Transportation needs:    Medical: Not on file    Non-medical: Not on file  Tobacco Use  . Smoking status: Never Smoker  . Smokeless  tobacco: Never Used  Substance and Sexual Activity  . Alcohol use: Yes    Comment: SOCIAL  . Drug use: Never  . Sexual activity: Not on file  Lifestyle  . Physical activity:    Days per week: Not on file    Minutes per session: Not on file  . Stress: Not on file  Relationships  . Social connections:    Talks on phone: Not on file    Gets together: Not on file    Attends religious service: Not on file    Active member of club or organization: Not on file    Attends meetings of clubs or organizations: Not on file    Relationship status: Not on file  . Intimate partner violence:    Fear of current or ex partner: Not on file    Emotionally abused: Not on file    Physically abused: Not on file    Forced sexual activity: Not on file  Other Topics Concern  . Not on file  Social History Narrative  . Not on file    No Known Allergies  Family History  Problem Relation Age of Onset  . Aneurysm Mother 70  . Hypertension Father   . CAD Maternal Grandmother     Prior to Admission medications   Medication  Sig Start Date End Date Taking? Authorizing Provider  amLODipine (NORVASC) 10 MG tablet Take 10 mg by mouth daily.   Yes [provider]  atorvastatin (LIPITOR) 20 MG tablet Take 20 mg by mouth daily.   Yes [provider]  lisinopril (PRINIVIL,ZESTRIL) 20 MG tablet Take 40 mg by mouth daily.    Yes [provider]  propranolol (INDERAL) 10 MG tablet Take 10 mg by mouth as needed (anxiety).   Yes [provider]    Physical Exam: Vitals:   12/07/17 0830 12/07/17 0900 12/07/17 0930 12/07/17 1100  BP: (!) 152/103 (!) 154/110 121/87 (!) 165/108  Pulse: 71 70 85 74  Resp: (!) 24 (!) 26 (!) 24 18  Temp:    98.2 F (36.8 C)  TempSrc:    Oral  SpO2: 97% 98% 100% 98%  Weight:      Height:         General:  Appears calm and comfortable and is NAD Eyes:  PERRL, EOMI, normal lids, iris ENT:  grossly normal hearing, lips & tongue, mmm;  appropriate dentition Neck:  no LAD, masses or thyromegaly; no carotid bruits Cardiovascular:  RRR, no m/r/g. No LE edema.  Respiratory:   CTA bilaterally with no wheezes/rales/rhonchi.  Normal respiratory effort. Abdomen:  soft, NT, ND, NABS Back:   normal alignment, no CVAT Skin:  no rash or induration seen on limited exam Musculoskeletal:  grossly normal tone BUE/BLE, good ROM, no bony abnormality Psychiatric:  grossly normal mood and affect, speech fluent and appropriate, AOx3 Neurologic:  CN 2-12 grossly intact, moves all extremities in coordinated fashion, sensation intact    Radiological Exams on Admission: Dg Chest 2 View  Result Date: 12/07/2017 CLINICAL DATA:  Dull chest pain. EXAM: CHEST - 2 VIEW COMPARISON:  None. FINDINGS: Heart is borderline in size. Lungs clear. No effusions. No acute bony abnormality. IMPRESSION: Borderline heart size.  No active disease. Electronically Signed   By: Charlett Nose M.D.   On: 12/07/2017 07:29    EKG: Independently reviewed.  NSR with rate 95; nonspecific ST changes with no evidence of acute ischemia   Labs on Admission: I have personally reviewed the available labs and imaging studies at the time of the admission.  Pertinent labs:   Glucose 160 BUN 15/Creatinine 1.35/GFR >60 Troponin <0.03 Unremarkable CBC   Assessment/Plan Principal Problem:   Chest pain Active Problems:   Essential hypertension   Dyslipidemia   Hyperglycemia   Chest pain -Patient with substernal chest pain that began spontaneously this AM and resolved with NTG. -2/3 typical symptoms suggestive of atypical cardiac chest pain.  -CXR unremarkable.   -Initial cardiac troponin negative.  -EKG not indicative of acute ischemia.   -Heart pathway score is 6, with a >1% risk of MACE within 30 days.  -Will plan to place in observation status on telemetry to rule out ACS by overnight observation.  -cycle troponin q6h x 3 and repeat EKG in AM -Start ASA 81 mg   daily -morphine given -Risk factor stratification with HgbA1c and FLP; will also check TSH and UDS -Cardiology consultation in AM - NPO for possible stress test  -Will plan to start Heparin drip if enzymes are positive and/or chest pain recurs  HTN -Takes Norvasc, Propranolol, and Lisinopril at home -Continue home medications for now  HLD -Continue Lipitor, but increase from 20 to 40 mg daily for now -Check fasting lipids in the AM  Hypergylcemia -Glucose 160 -No known h/o DM -Will check A1c -  Will follow without insulin for now  DVT prophylaxis:   Lovenox Code Status:  Full - confirmed with patient Family Communication: None present (patient was on the phone with his father and so it is likely that his father was able to hear the entire encounter, but did not contribute) Disposition Plan:  Home once clinically improved Consults called: Cardiology to see in AM; inbox message sent to [employer]  Admission status: It is my clinical opinion that referral for OBSERVATION is reasonable and necessary in this patient based on the above information provided. The aforementioned taken together are felt to place the patient at high risk for further clinical deterioration. However it is anticipated that the patient may be medically stable for discharge from the hospital within 24 to 48 hours.    Jonah Blue MD [facility]  If note is complete, please contact covering daytime or nighttime physician. [email or web address] Password [email or web address]  12/07/2017, 1:19 PM

## 2017-12-08 ENCOUNTER — Observation Stay (HOSPITAL_BASED_OUTPATIENT_CLINIC_OR_DEPARTMENT_OTHER): Payer: BLUE CROSS/BLUE SHIELD

## 2017-12-08 ENCOUNTER — Encounter (HOSPITAL_COMMUNITY): Payer: Self-pay | Admitting: Internal Medicine

## 2017-12-08 DIAGNOSIS — I259 Chronic ischemic heart disease, unspecified: Secondary | ICD-10-CM

## 2017-12-08 DIAGNOSIS — I214 Non-ST elevation (NSTEMI) myocardial infarction: Secondary | ICD-10-CM | POA: Diagnosis not present

## 2017-12-08 DIAGNOSIS — E785 Hyperlipidemia, unspecified: Secondary | ICD-10-CM

## 2017-12-08 DIAGNOSIS — I10 Essential (primary) hypertension: Secondary | ICD-10-CM

## 2017-12-08 DIAGNOSIS — I2510 Atherosclerotic heart disease of native coronary artery without angina pectoris: Secondary | ICD-10-CM | POA: Diagnosis not present

## 2017-12-08 DIAGNOSIS — Z23 Encounter for immunization: Secondary | ICD-10-CM | POA: Diagnosis not present

## 2017-12-08 DIAGNOSIS — I5030 Unspecified diastolic (congestive) heart failure: Secondary | ICD-10-CM | POA: Diagnosis not present

## 2017-12-08 DIAGNOSIS — I472 Ventricular tachycardia: Secondary | ICD-10-CM | POA: Diagnosis not present

## 2017-12-08 DIAGNOSIS — R739 Hyperglycemia, unspecified: Secondary | ICD-10-CM | POA: Diagnosis not present

## 2017-12-08 DIAGNOSIS — I428 Other cardiomyopathies: Secondary | ICD-10-CM

## 2017-12-08 LAB — CBC
HCT: 49 % (ref 39.0–52.0)
Hemoglobin: 15.2 g/dL (ref 13.0–17.0)
MCH: 26 pg (ref 26.0–34.0)
MCHC: 31 g/dL (ref 30.0–36.0)
MCV: 83.9 fL (ref 78.0–100.0)
PLATELETS: 176 10*3/uL (ref 150–400)
RBC: 5.84 MIL/uL — ABNORMAL HIGH (ref 4.22–5.81)
RDW: 15.7 % — ABNORMAL HIGH (ref 11.5–15.5)
WBC: 5.7 10*3/uL (ref 4.0–10.5)

## 2017-12-08 LAB — LIPID PANEL
CHOLESTEROL: 244 mg/dL — AB (ref 0–200)
HDL: 42 mg/dL (ref >40)
LDL Cholesterol: 171 mg/dL — ABNORMAL HIGH (ref 0–99)
TRIGLYCERIDES: 155 mg/dL — AB (ref <150)
Total CHOL/HDL Ratio: 5.8 RATIO
VLDL: 31 mg/dL (ref 0–40)

## 2017-12-08 LAB — ECHOCARDIOGRAM COMPLETE
HEIGHTINCHES: 68 in
WEIGHTICAEL: 3857.6 [oz_av]

## 2017-12-08 LAB — C-REACTIVE PROTEIN

## 2017-12-08 LAB — TROPONIN I: Troponin I: 6.13 ng/mL (ref <0.03)

## 2017-12-08 LAB — HIV ANTIBODY (ROUTINE TESTING W REFLEX): HIV Screen 4th Generation wRfx: NONREACTIVE

## 2017-12-08 LAB — SEDIMENTATION RATE: SED RATE: 1 mm/h (ref 0–16)

## 2017-12-08 MED ORDER — ATORVASTATIN CALCIUM 80 MG PO TABS
80.0000 mg | ORAL_TABLET | Freq: Every day | ORAL | Status: DC
  Administered 2017-12-08 – 2017-12-09 (×2): 80 mg via ORAL
  Filled 2017-12-08 (×2): qty 1

## 2017-12-08 MED ORDER — LISINOPRIL 40 MG PO TABS
40.0000 mg | ORAL_TABLET | Freq: Every day | ORAL | Status: DC
  Administered 2017-12-08 – 2017-12-09 (×2): 40 mg via ORAL
  Filled 2017-12-08 (×2): qty 1

## 2017-12-08 MED ORDER — CARVEDILOL 6.25 MG PO TABS
6.2500 mg | ORAL_TABLET | Freq: Two times a day (BID) | ORAL | Status: DC
  Administered 2017-12-08 – 2017-12-09 (×3): 6.25 mg via ORAL
  Filled 2017-12-08 (×3): qty 1

## 2017-12-08 MED FILL — Heparin Sodium (Porcine) Inj 1000 Unit/ML: INTRAMUSCULAR | Qty: 10 | Status: AC

## 2017-12-08 NOTE — Progress Notes (Signed)
CCMD called to notify pt had a 9 beat run of Vtach. This RN was in room when occurred. Pt was sleeping and asymptomatic. Will continue to monitor.

## 2017-12-08 NOTE — Progress Notes (Signed)
Progress Note  Patient Name: Jeffrey Moreno Date of Encounter: 12/08/2017  Primary Cardiologist: Jodelle Red, MD   Subjective   Patient doing well today. No chest pain. One brief episode of NSVT overnight, did not notice. Troponin is trending up, but he feels fine.  Reviewed in depth history again with him. No recent illnesses, sick contacts, recent travel. Has had a lot of personal and work related stress in his life, some very major. Has had issues with elevated blood pressure; medications are more recent, but has had elevated readings for at least the last several years. No family history of sudden cardiac death. Only heart failure is in an elderly relative.  Inpatient Medications    Scheduled Meds: . amLODipine  10 mg Oral Daily  . aspirin  81 mg Oral Pre-Cath  . aspirin EC  81 mg Oral Daily  . atorvastatin  80 mg Oral Daily  . carvedilol  6.25 mg Oral BID WC  . furosemide  20 mg Intravenous Daily  . heparin  5,000 Units Subcutaneous Q8H  . Influenza vac split quadrivalent PF  0.5 mL Intramuscular Tomorrow-1000  . lisinopril  40 mg Oral Daily  . sodium chloride flush  3 mL Intravenous Q12H   Continuous Infusions: . sodium chloride     PRN Meds: sodium chloride, acetaminophen, morphine injection, ondansetron (ZOFRAN) IV, sodium chloride flush   Vital Signs    Vitals:   12/07/17 1648 12/07/17 1714 12/07/17 2144 12/08/17 0618  BP: (!) 148/112 (!) 159/110 (!) 146/98 (!) 148/100  Pulse: 88 78 79 79  Resp:  14    Temp:  97.8 F (36.6 C) 97.9 F (36.6 C) 98.3 F (36.8 C)  TempSrc:  Oral Oral Oral  SpO2:  98% 98% 96%  Weight:    109.4 kg  Height:  5\' 8"  (1.727 m)      Intake/Output Summary (Last 24 hours) at 12/08/2017 1028 Last data filed at 12/08/2017 0900 Gross per 24 hour  Intake 932.3 ml  Output 1225 ml  Net -292.7 ml   Filed Weights   12/07/17 0641 12/08/17 0618  Weight: 108.9 kg 109.4 kg    Telemetry    NSR with a single run of 6 beat  NSVT - Personally Reviewed  ECG    NSR, LAD, lateral TWI - Personally Reviewed  Physical Exam   GEN: No acute distress.   Neck: supple, no JVD Cardiac: regular S1 and S2, no murmurs, rubs, or gallops.  Respiratory: Clear to auscultation bilaterally. GI: Soft, nontender, non-distended. Bowel sounds normal MS: No edema; No deformity. Neuro:  Nonfocal, moves all limbs independently Psych: Normal affect   Labs    Chemistry Recent Labs  Lab 12/07/17 0646 12/07/17 1831  NA 141  --   K 4.0  --   CL 105  --   CO2 25  --   GLUCOSE 160*  --   BUN 15  --   CREATININE 1.35* 1.25*  CALCIUM 9.6  --   GFRNONAA >60 >60  GFRAA >60 >60  ANIONGAP 11  --      Hematology Recent Labs  Lab 12/07/17 0646 12/07/17 1831 12/08/17 0003  WBC 4.1 5.9 5.7  RBC 5.84* 5.69 5.84*  HGB 15.6 15.1 15.2  HCT 47.2 47.8 49.0  MCV 80.8 84.0 83.9  MCH 26.7 26.5 26.0  MCHC 33.1 31.6 31.0  RDW 15.7* 15.1 15.7*  PLT 157 169 176    Cardiac Enzymes Recent Labs  Lab 12/07/17 0646 12/07/17  1216 12/07/17 1831 12/08/17 0003  TROPONINI <0.03 0.79* 6.38* 6.13*   No results for input(s): TROPIPOC in the last 168 hours.   BNPNo results for input(s): BNP, PROBNP in the last 168 hours.   DDimer No results for input(s): DDIMER in the last 168 hours.   Radiology    Dg Chest 2 View  Result Date: 12/07/2017 CLINICAL DATA:  Dull chest pain. EXAM: CHEST - 2 VIEW COMPARISON:  None. FINDINGS: Heart is borderline in size. Lungs clear. No effusions. No acute bony abnormality. IMPRESSION: Borderline heart size.  No active disease. Electronically Signed   By: Charlett Nose M.D.   On: 12/07/2017 07:29    Cardiac Studies   Cath 12/07/17 Conclusions: 1. Mild, nonobstructive coronary artery disease with focal 30% stenosis in the mid RCA.  Lesion severity may be accentuated by tortuosity. 2. Dilated left ventricle with severely reduced systolic function, consistent with nonischemic cardiomyopathy.  Troponin  elevation may reflect supply-demand mismatch in the setting of severely reduced LVEF or myocarditis. 3. Moderately elevated left ventricular filling pressure.  Recommendations: 1. Medical therapy for systolic heart failure due to nonischemic cardiomyopathy.  Will initiate furosemide 20 mg IV daily, to be titrated based on urine output.  Further escalation of evidence-based heart failure therapy per primary service. 2. Medical therapy and risk factor modification to prevent progression of CAD.  No indication for antiplatelet therapy at this time.   Echo is pending final read, on my read appears to be reduced EF with significantly abnormal tissue Doppler readings but no atrial dilation or significant TR. Does appear to have some PR. Hypokinesis appears diffuse, worse at apex than base.  Patient Profile     47 y.o. male with PMH hypertension, hyperlipidemia, asthma, obesity presents after episode of chest discomfort the morning on 12/07/17. Troponins initially negative, rose to 0.79 prior to cath, peak >6. Cors are nearly clean, EF appears down, evaluating cause of NICM.  Assessment & Plan    Nonischemic Cardiomyopathy -nonsustained VT, on beta blocker now. Rare, has been hemodynamically stable -Differential: genetic (no clear family history, but he could be de novo), hypertension driven (possible given his history, but unclear as to why he would now have an elevation in enzymes), myocarditis (fits the troponin trend and echo findings, but no symptoms of infection; sending inflammatory markers), takotsubo's (has stress, fits the troponin trend, normal cors, echo somewhat similar given that apex is worse than base), idiopathic, restrictive/infiltrative (lack of atrial change not as consistent, but tissue dopplers very diminished. Question timing/acuity). -starting goal directed medical therapy. Restarted his home 40 mg lisinopril. Starting carvedilol today. Appears euvolemic today. Will uptitrate  carvedilol as able, if renal function/potassium allow, will trial spironolactone.  Hypertension: -has been elevated for at least several years, started therapy more recently. -restarted home lisinopril 40 mg, will monitor renal function -restarted home amlodipine 10 mg -added carvedilol for NICM as above, will uptitrate as able.  Hyperlipidemia: Lipids show Tchol 244, TG 155, HDL 42, LDL 171. Will clarify how long he has been on atorvastatin. If he has been compliant and on >6 weeks, will need to adjust dosing. Would be helpful to know what his lipids were at baseline. While he does not have obstructive CAD at this time, with his risk factors would like to prevent progression in the future.  Obesity: Will need to address lifestyle, diet, exercise   Time Spent Directly with Patient: I have spent a total of 40 minutes with the patient reviewing hospital notes,  telemetry, EKGs, labs and examining the patient as well as establishing an assessment and plan that was discussed personally with the patient.  > 50% of time was spent in direct patient care.  Length of Stay:  LOS: 0 days   Jodelle Red, MD, PhD [facility]  [facility]   12/08/2017, 10:28 AM      For questions or updates, please contact [facility] Please consult [email or web address] for contact info under Cardiology/STEMI.

## 2017-12-08 NOTE — Progress Notes (Signed)
Removed TR band from right radial. Site is a level 0. Placed gauze and Tegaderm. Pt educated to leave in place for 24 hrs. Pt verbalizes understanding. Will continue to monitor site.

## 2017-12-08 NOTE — Progress Notes (Signed)
  Echocardiogram 2D Echocardiogram has been performed.  Leta Jungling M 12/08/2017, 10:05 AM

## 2017-12-08 NOTE — Progress Notes (Signed)
PROGRESS NOTE  Jeffrey Moreno  JPV:[id number] DOB: Aug 01, 1970 DOA: 12/07/2017 PCP: Deatra James, MD   Brief Narrative: Jeffrey Moreno is a 47 y.o. male with a history of HTN, HLD, obesity, anxiety, and intermittent asthma who presented with substernal chest pain associated with dizziness and diaphoresis, not exertional, completely resolved with nitroglycerin in ED. Initial troponin negative without ST elevations on ECG. Transferred MCHP > MCH for ACS evaluation and troponin trended upward prompting emergent cardiac catheterization where only a 30% stenosis of the mid RCA was noted. Left sided filling pressures were elevated and EF was severely reduced so IV lasix was started. Cardiology continues to follow and medication management is being titrated.   Assessment & Plan: Principal Problem:   Chest pain Active Problems:   Essential hypertension   Dyslipidemia   Hyperglycemia   Non-ST elevation (NSTEMI) myocardial infarction [facility])  Nonischemic cardiomyopathy, acute HFrEF: Nearly clean cors (30% midRCA stenosis with tortuosity) and elevated LVEDP on catheterization 12/07/1017. EF 30-35%, G2DD on echo. Broad DDx, considering takotsubo cardiomyopathy with significant life stressors and myocarditis, though no recent viral syndrome.  - Cardiology consulted. Starting coreg, continue lisinopril, restarted home norvasc. If Cr, K stable, planning spironolactone addition.  - Inflammatory markers sent for work up. Will need cardiology follow up. - On lasix 20mg  IV daily.  HTN:  - Antihypertensives as above  Hyperlipidemia: LDL 171. Pt on lipitor 20mg  PTA - Augment to 80mg  daily and recheck lipid panel in 6 weeks.   NSVT: Asymptomatic.  - Monitor on telemetry, starting BB  Prediabetes: HbA1c 6.4%.  - Therapeutic lifestyle changes recommended. Will consult dietitian.  - Needs PCP follow up to include this as well.  Obesity: BMI 36.  - TLC as above.   DVT prophylaxis: Lovenox Code Status:  Full Family Communication: None at bedside Disposition Plan: Home in the next 24 hours.  Consultants:   Cardiology  Procedures:  12/07/17 LEFT HEART CATH AND CORONARY ANGIOGRAPHY End, Cristal Deer, MD   Echocardiogram 12/08/2017: - Left ventricle: The cavity size was normal. There was mild   concentric hypertrophy. Systolic function was moderately to   severely reduced. The estimated ejection fraction was in the   range of 30% to 35%. Moderate diffuse hypokinesis with no   identifiable regional variations. Features are consistent with a   pseudonormal left ventricular filling pattern, with concomitant   abnormal relaxation and increased filling pressure (grade 2   diastolic dysfunction). No evidence of thrombus. - Left atrium: The atrium was mildly dilated.  Antimicrobials:  None   Subjective: No current chest pain, denies orthostatic dizziness or dyspnea.   Objective: Vitals:   12/07/17 2144 12/08/17 0618 12/08/17 1134 12/08/17 1412  BP: (!) 146/98 (!) 148/100 (!) 148/104 124/85  Pulse: 79 79 94   Resp:   20 20  Temp: 97.9 F (36.6 C) 98.3 F (36.8 C) 98.7 F (37.1 C) 99 F (37.2 C)  TempSrc: Oral Oral Oral Oral  SpO2: 98% 96% 98% 99%  Weight:  109.4 kg    Height:        Intake/Output Summary (Last 24 hours) at 12/08/2017 1609 Last data filed at 12/08/2017 1200 Gross per 24 hour  Intake 812.3 ml  Output 1225 ml  Net -412.7 ml   Filed Weights   12/07/17 0641 12/08/17 0618  Weight: 108.9 kg 109.4 kg    Gen: 47 y.o. male in no distress  Pulm: Non-labored breathing. Clear to auscultation bilaterally.  CV: Regular rate and rhythm. No murmur, rub,  or gallop.  No significant pedal edema. GI: Abdomen soft, non-tender, non-distended, with normoactive bowel sounds. No organomegaly or masses felt. Ext: Warm, no deformities Skin: No rashes, lesions no ulcers Neuro: Alert and oriented. No focal neurological deficits. Psych: Judgement and insight appear normal. Mood &  affect appropriate.   Data Reviewed: I have personally reviewed following labs and imaging studies  CBC: Recent Labs  Lab 12/07/17 0646 12/07/17 1831 12/08/17 0003  WBC 4.1 5.9 5.7  NEUTROABS 2.6  --   --   HGB 15.6 15.1 15.2  HCT 47.2 47.8 49.0  MCV 80.8 84.0 83.9  PLT 157 169 176   Basic Metabolic Panel: Recent Labs  Lab 12/07/17 0646 12/07/17 1831  NA 141  --   K 4.0  --   CL 105  --   CO2 25  --   GLUCOSE 160*  --   BUN 15  --   CREATININE 1.35* 1.25*  CALCIUM 9.6  --    GFR: Estimated Creatinine Clearance: 86.7 mL/min (A) (by C-G formula based on SCr of 1.25 mg/dL (H)). Liver Function Tests: No results for input(s): AST, ALT, ALKPHOS, BILITOT, PROT, ALBUMIN in the last 168 hours. No results for input(s): LIPASE, AMYLASE in the last 168 hours. No results for input(s): AMMONIA in the last 168 hours. Coagulation Profile: No results for input(s): INR, PROTIME in the last 168 hours. Cardiac Enzymes: Recent Labs  Lab 12/07/17 0646 12/07/17 1216 12/07/17 1831 12/08/17 0003  TROPONINI <0.03 0.79* 6.38* 6.13*   BNP (last 3 results) No results for input(s): PROBNP in the last 8760 hours. HbA1C: Recent Labs    12/07/17 1216  HGBA1C 6.4*   CBG: No results for input(s): GLUCAP in the last 168 hours. Lipid Profile: Recent Labs    12/08/17 0003  CHOL 244*  HDL 42  LDLCALC 171*  TRIG 155*  CHOLHDL 5.8   Thyroid Function Tests: Recent Labs    12/07/17 1216  TSH 0.915   Anemia Panel: No results for input(s): VITAMINB12, FOLATE, FERRITIN, TIBC, IRON, RETICCTPCT in the last 72 hours. Urine analysis: No results found for: COLORURINE, APPEARANCEUR, LABSPEC, PHURINE, GLUCOSEU, HGBUR, BILIRUBINUR, KETONESUR, PROTEINUR, UROBILINOGEN, NITRITE, LEUKOCYTESUR No results found for this or any previous visit (from the past 240 hour(s)).    Radiology Studies: Dg Chest 2 View  Result Date: 12/07/2017 CLINICAL DATA:  Dull chest pain. EXAM: CHEST - 2 VIEW  COMPARISON:  None. FINDINGS: Heart is borderline in size. Lungs clear. No effusions. No acute bony abnormality. IMPRESSION: Borderline heart size.  No active disease. Electronically Signed   By: Charlett Nose M.D.   On: 12/07/2017 07:29    Scheduled Meds: . amLODipine  10 mg Oral Daily  . aspirin  81 mg Oral Pre-Cath  . aspirin EC  81 mg Oral Daily  . atorvastatin  80 mg Oral Daily  . carvedilol  6.25 mg Oral BID WC  . furosemide  20 mg Intravenous Daily  . heparin  5,000 Units Subcutaneous Q8H  . Influenza vac split quadrivalent PF  0.5 mL Intramuscular Tomorrow-1000  . lisinopril  40 mg Oral Daily  . sodium chloride flush  3 mL Intravenous Q12H   Continuous Infusions: . sodium chloride       LOS: 0 days   Time spent: 25 minutes.  Tyrone Nine, MD [facility] [email or web address] Password [facility] 12/08/2017, 4:09 PM

## 2017-12-09 DIAGNOSIS — I10 Essential (primary) hypertension: Secondary | ICD-10-CM | POA: Diagnosis not present

## 2017-12-09 DIAGNOSIS — I259 Chronic ischemic heart disease, unspecified: Secondary | ICD-10-CM | POA: Diagnosis not present

## 2017-12-09 DIAGNOSIS — I214 Non-ST elevation (NSTEMI) myocardial infarction: Secondary | ICD-10-CM | POA: Diagnosis not present

## 2017-12-09 DIAGNOSIS — R739 Hyperglycemia, unspecified: Secondary | ICD-10-CM | POA: Diagnosis not present

## 2017-12-09 DIAGNOSIS — E785 Hyperlipidemia, unspecified: Secondary | ICD-10-CM | POA: Diagnosis not present

## 2017-12-09 LAB — BASIC METABOLIC PANEL
Anion gap: 12 (ref 5–15)
BUN: 15 mg/dL (ref 6–20)
CHLORIDE: 101 mmol/L (ref 98–111)
CO2: 22 mmol/L (ref 22–32)
CREATININE: 1.44 mg/dL — AB (ref 0.61–1.24)
Calcium: 9 mg/dL (ref 8.9–10.3)
GFR, EST NON AFRICAN AMERICAN: 56 mL/min — AB (ref >60)
Glucose, Bld: 102 mg/dL — ABNORMAL HIGH (ref 70–99)
POTASSIUM: 4.5 mmol/L (ref 3.5–5.1)
SODIUM: 135 mmol/L (ref 135–145)

## 2017-12-09 MED ORDER — MELATONIN 3 MG PO TABS
3.0000 mg | ORAL_TABLET | Freq: Once | ORAL | Status: AC
  Administered 2017-12-09: 3 mg via ORAL
  Filled 2017-12-09: qty 1

## 2017-12-09 MED ORDER — ATORVASTATIN CALCIUM 80 MG PO TABS: 80.0000 mg | ORAL_TABLET | Freq: Every day | ORAL | 0 refills | Status: AC

## 2017-12-09 MED ORDER — CARVEDILOL 6.25 MG PO TABS: 6.2500 mg | ORAL_TABLET | Freq: Two times a day (BID) | ORAL | 0 refills | Status: DC

## 2017-12-09 NOTE — Plan of Care (Signed)
Nutrition Education Note  RD consulted for nutrition education regarding pre-diabetes.  Pt reports that he changed his diet two weeks ago and stopped going out to eat at fast food restaurants for lunch. Pt states that he now has a "lighter" lunch of trail mix (nuts and dried fruit) or a breakfast bar. Pt drinks water with flavoring packets or sweet tea throughout the day. Pt reports recently weaning himself off of soda.  Breakfast: banana and yogurt Lunch: handful of trail mix or breakfast bar (used to be [employer] or [employer]) Dinner: spinach, chicken, and potatoes cooked with olive oil or a bowl of cereal  Pt with specific questions about eating out and snacks. All questions answered.  Lab Results  Component Value Date   HGBA1C 6.4 (H) 12/07/2017    RD provided "Carbohydrate Counting for People with Diabetes" handout from the Academy of [employer] and Dietetics. Discussed different food groups and their effects on blood sugar, emphasizing carbohydrate-containing foods. Provided list of carbohydrates and recommended serving sizes of common foods. Encouraged pt to switch from sweet tea to unsweetened tea.  Discussed importance of controlled and consistent carbohydrate intake throughout the day. Provided examples of ways to balance meals/snacks and encouraged intake of high-fiber, whole grain complex carbohydrates. Teach back method used.  Expect good compliance.  Body mass index is 36.29 kg/m. Pt meets criteria for obesity based on current BMI.  Current diet order is Heart Healthy, patient is consuming approximately 75-100% of meals at this time. Labs and medications reviewed. No further nutrition interventions warranted at this time. RD contact information provided. If additional nutrition issues arise, please re-consult RD.   Earma Reading, MS, RD, LDN Pager: [phone] Weekend/After Hours: [phone]

## 2017-12-09 NOTE — Progress Notes (Addendum)
Progress Note  Patient Name: Jeffrey Moreno Date of Encounter: 12/09/2017  Primary Cardiologist: Jodelle Red, MD   Subjective   Patient doing well today. No chest pain. Rare PVCs. Up and walking this AM.  Reviewed timeline of medications: Lisinopril since about 05/2017 Amlodipine for the last month Atorvastatin for the last month  Inpatient Medications    Scheduled Meds: . amLODipine  10 mg Oral Daily  . aspirin EC  81 mg Oral Daily  . atorvastatin  80 mg Oral Daily  . carvedilol  6.25 mg Oral BID WC  . furosemide  20 mg Intravenous Daily  . heparin  5,000 Units Subcutaneous Q8H  . Influenza vac split quadrivalent PF  0.5 mL Intramuscular Tomorrow-1000  . lisinopril  40 mg Oral Daily  . sodium chloride flush  3 mL Intravenous Q12H   Continuous Infusions: . sodium chloride     PRN Meds: sodium chloride, acetaminophen, morphine injection, ondansetron (ZOFRAN) IV, sodium chloride flush   Vital Signs    Vitals:   12/08/17 1134 12/08/17 1412 12/08/17 1932 12/09/17 0459  BP: (!) 148/104 124/85 (!) 140/98 (!) 139/95  Pulse: 94  80 85  Resp: 20 20 19 20   Temp: 98.7 F (37.1 C) 99 F (37.2 C) 98.1 F (36.7 C) 98.3 F (36.8 C)  TempSrc: Oral Oral Oral Oral  SpO2: 98% 99% 100% 98%  Weight:    108.3 kg  Height:        Intake/Output Summary (Last 24 hours) at 12/09/2017 0839 Last data filed at 12/08/2017 1700 Gross per 24 hour  Intake 360 ml  Output 450 ml  Net -90 ml   Filed Weights   12/07/17 0641 12/08/17 0618 12/09/17 0459  Weight: 108.9 kg 109.4 kg 108.3 kg    Telemetry    NSR rare PVCs - Personally Reviewed  ECG    No new - Personally Reviewed  Physical Exam   GEN: No acute distress.   Neck: supple, no JVD Cardiac: regular S1 and S2, no murmurs, rubs, or gallops.  Respiratory: Clear to auscultation bilaterally. GI: Soft, nontender, non-distended. Bowel sounds normal MS: No edema; No deformity. Neuro:  Nonfocal, moves all limbs  independently Psych: Normal affect   Labs    Chemistry Recent Labs  Lab 12/07/17 0646 12/07/17 1831 12/09/17 0441  NA 141  --  135  K 4.0  --  4.5  CL 105  --  101  CO2 25  --  22  GLUCOSE 160*  --  102*  BUN 15  --  15  CREATININE 1.35* 1.25* 1.44*  CALCIUM 9.6  --  9.0  GFRNONAA >60 >60 56*  GFRAA >60 >60 >60  ANIONGAP 11  --  12     Hematology Recent Labs  Lab 12/07/17 0646 12/07/17 1831 12/08/17 0003  WBC 4.1 5.9 5.7  RBC 5.84* 5.69 5.84*  HGB 15.6 15.1 15.2  HCT 47.2 47.8 49.0  MCV 80.8 84.0 83.9  MCH 26.7 26.5 26.0  MCHC 33.1 31.6 31.0  RDW 15.7* 15.1 15.7*  PLT 157 169 176    Cardiac Enzymes Recent Labs  Lab 12/07/17 0646 12/07/17 1216 12/07/17 1831 12/08/17 0003  TROPONINI <0.03 0.79* 6.38* 6.13*   No results for input(s): TROPIPOC in the last 168 hours.   BNPNo results for input(s): BNP, PROBNP in the last 168 hours.   DDimer No results for input(s): DDIMER in the last 168 hours.   Radiology    No results found.  Cardiac  Studies   Cath 12/07/17 Conclusions: 1. Mild, nonobstructive coronary artery disease with focal 30% stenosis in the mid RCA.  Lesion severity may be accentuated by tortuosity. 2. Dilated left ventricle with severely reduced systolic function, consistent with nonischemic cardiomyopathy.  Troponin elevation may reflect supply-demand mismatch in the setting of severely reduced LVEF or myocarditis. 3. Moderately elevated left ventricular filling pressure.  Recommendations: 1. Medical therapy for systolic heart failure due to nonischemic cardiomyopathy.  Will initiate furosemide 20 mg IV daily, to be titrated based on urine output.  Further escalation of evidence-based heart failure therapy per primary service. 2. Medical therapy and risk factor modification to prevent progression of CAD.  No indication for antiplatelet therapy at this time.   Echo on my read appears to be reduced EF with significantly abnormal tissue  Doppler readings but no atrial dilation or significant TR. Does appear to have some PR. Hypokinesis appears diffuse, worse at apex than base.  Final read Study Conclusions  - Left ventricle: The cavity size was normal. There was mild   concentric hypertrophy. Systolic function was moderately to   severely reduced. The estimated ejection fraction was in the   range of 30% to 35%. Moderate diffuse hypokinesis with no   identifiable regional variations. Features are consistent with a   pseudonormal left ventricular filling pattern, with concomitant   abnormal relaxation and increased filling pressure (grade 2   diastolic dysfunction). No evidence of thrombus. - Left atrium: The atrium was mildly dilated.  Patient Profile     47 y.o. male with PMH hypertension, hyperlipidemia, asthma, obesity presents after episode of chest discomfort the morning on 12/07/17. Troponins initially negative, rose to 0.79 prior to cath, peak >6. Cors are nearly clean, EF down, evaluating cause of NICM.  Assessment & Plan    Nonischemic Cardiomyopathy See below for differential. Given acuity, pattern on echo, and description, leading thought is for a takotsubo vs. Hypertension, for reasons described below. Takostubo fits timeline of symptoms and troponins; hypertension has needed increased control. Echo with global hypokinesis, though strain patten suggests apex is slightly worse than base.  -Differential: genetic (no clear family history, but he could be de novo), hypertension driven (possible given his history, but unclear as to why he would now have an elevation in enzymes), myocarditis (fits the troponin trend and echo findings, but no symptoms of infection; normal inflammatory markers), takotsubo's (has stress, fits the troponin trend, normal cors, echo somewhat similar given that apex is worse than base), idiopathic, restrictive/infiltrative (lack of atrial change not as consistent, but tissue dopplers very  diminished. Question timing/acuity). -starting goal directed medical therapy. Restarted his home 40 mg lisinopril. Started carvedilol yesterday, tolerating well. Appears euvolemic today. Will uptitrate carvedilol as able as an outpatient. No kidney/potassium room for spironolactone. If his low EF persists, will discuss entresto as an outpatient.  -no need for aspirin, discontinued today -he will monitor weights. Will determine if he needs weight based lasix at follow up.  Hypertension: Lisinopril since about 05/2017 Amlodipine for the last month -restarted home lisinopril 40 mg, will monitor renal function -restarted home amlodipine 10 mg -added carvedilol for NICM as above, will uptitrate as outpatient. -directed on checking home BP, bringing log with him  Hyperlipidemia: Lipids show Tchol 244, TG 155, HDL 42, LDL 171. On atorvastatin ~1 month.  -Increased atorvastatin to 80 mg. Recheck in 3 mos. While he does not have obstructive CAD at this time, with his risk factors would like to prevent progression  in the future.  Obesity: Will need to address lifestyle, diet, exercise as an outpatient  I will see him at my [facility] office on 9/18. I will arrange appointment. Recommend that he be off work until after that appointment. Ok to discharge home today.  [facility] will sign off in anticipation of discharge.   Medication Recommendations:  Amlodipine 10 mg, atorvastatin 80 mg, carvedilol 6.25 mg twice daily, lisinopril 40 mg Other recommendations (labs, testing, etc):  none Follow up as an outpatient:  Dr. Cristal Deer at [facility] 9/18   Time Spent Directly with Patient: I have spent a total of 35 minutes with the patient reviewing hospital notes, telemetry, EKGs, labs and examining the patient as well as establishing an assessment and plan that was discussed personally with the patient.  > 50% of time was spent in direct patient care. Heart failure education  provided.  Length of Stay:  LOS: 0 days   Jodelle Red, MD, PhD [facility]   12/09/2017, 8:39 AM  For questions or updates, please contact [facility] Please consult [email or web address] for contact info under Cardiology/STEMI.

## 2017-12-09 NOTE — Discharge Summary (Signed)
Physician Discharge Summary  Jeffrey Moreno ZOX:[id number] DOB: 1970/06/17 DOA: 12/07/2017  PCP: Deatra James, MD  Admit date: 12/07/2017 Discharge date: 12/09/2017  Admitted From: Home Disposition: Home   Recommendations for Outpatient Follow-up:  1. Follow up with cardiology, Dr. Cristal Deer 9/18. Recommend repeat BMP and monitor BP/HR.   Home Health: None Equipment/Devices: None Discharge Condition: Stable CODE STATUS: Full Diet recommendation: Heart healthy  Brief/Interim Summary: Jeffrey Moreno is a 47 y.o. male with a history of HTN, HLD, obesity, anxiety, and intermittent asthma who presented with substernal chest pain associated with dizziness and diaphoresis, not exertional, completely resolved with nitroglycerin in ED. Initial troponin negative without ST elevations on ECG. Transferred MCHP > MCH for ACS evaluation and troponin trended upward prompting emergent cardiac catheterization where only a 30% stenosis of the mid RCA was noted. Left sided filling pressures were elevated and EF was severely reduced so IV lasix was started. Cardiology assisted with medication titration and the patient's symptoms have subsided.   Discharge Diagnoses:  Principal Problem:   Chest pain Active Problems:   Essential hypertension   Dyslipidemia   Hyperglycemia   Non-ST elevation (NSTEMI) myocardial infarction [facility])  Nonischemic cardiomyopathy, acute HFrEF: Nearly clean cors (30% midRCA stenosis with tortuosity) and elevated LVEDP on catheterization 12/07/1017. EF 30-35%, G2DD on echo. Broad DDx, considering takotsubo cardiomyopathy with significant life stressors and myocarditis, though no recent viral syndrome.  - Cardiology consulted. Started coreg, continued lisinopril, restarted home norvasc. - Inflammatory markers sent for work up, negative. Will need cardiology follow up as scheduled. - Euvolemic, no diuretic recommended per cardiology.  HTN:  - Antihypertensives as  above  Hyperlipidemia: LDL 171. Pt on lipitor 20mg  PTA - Augmented to 80mg  daily and recheck lipid panel in 6 weeks.   NSVT: Asymptomatic.  - Monitored on telemetry, starting BB  Prediabetes: HbA1c 6.4%.  - Therapeutic lifestyle changes recommended. Dietitian consulted.  - Needs PCP follow up to include this as well.  Obesity: BMI 36.  - TLC as above.   Discharge Instructions Discharge Instructions    Diet - low sodium heart healthy   Complete by:  As directed    Discharge instructions   Complete by:  As directed    Your medications have been changed as below to optimize your risk profile for heart disease. You will need to pick these up at [facility]. Follow up with Dr. Cristal Deer on 9/18. This is scheduled, though it may be at a different office. Please call on Monday to verify. If your symptoms return, seek medical attention right away.   Increase activity slowly   Complete by:  As directed      Allergies as of 12/09/2017   No Known Allergies     Medication List    STOP taking these medications   ibuprofen 200 MG tablet Commonly known as:  ADVIL,MOTRIN   propranolol 10 MG tablet Commonly known as:  INDERAL     TAKE these medications   albuterol 108 (90 Base) MCG/ACT inhaler Commonly known as:  PROVENTIL HFA;VENTOLIN HFA Inhale 2 puffs into the lungs every 6 (six) hours as needed.   amLODipine 10 MG tablet Commonly known as:  NORVASC Take 10 mg by mouth daily.   atorvastatin 80 MG tablet Commonly known as:  LIPITOR Take 1 tablet (80 mg total) by mouth daily. What changed:    medication strength  how much to take   carvedilol 6.25 MG tablet Commonly known as:  COREG Take 1 tablet (6.25 mg  total) by mouth 2 (two) times daily with a meal.   lisinopril 40 MG tablet Commonly known as:  PRINIVIL,ZESTRIL Take 40 mg by mouth daily.      Follow-up Information    Deatra James, MD Follow up.   Specialty:  Family Medicine Contact information: [phone] [address]  [address] [place] [address] [phone]        Jodelle Red, MD Follow up.   Specialty:  Cardiology Why:  Follow up is scheduled 9/18 with Dr. Cristal Deer, though it may be at a different office. Please call on Monday to verify.  Contact information: [address] [place] [address] [place] [address] [phone]          No Known Allergies  Consultations:  Cardiology  Procedures/Studies: Dg Chest 2 View  Result Date: 12/07/2017 CLINICAL DATA:  Dull chest pain. EXAM: CHEST - 2 VIEW COMPARISON:  None. FINDINGS: Heart is borderline in size. Lungs clear. No effusions. No acute bony abnormality. IMPRESSION: Borderline heart size.  No active disease. Electronically Signed   By: Charlett Nose M.D.   On: 12/07/2017 07:29   12/07/17 LEFT HEART CATH AND CORONARY ANGIOGRAPHY End, Cristal Deer, MD   Echocardiogram 12/08/2017: - Left ventricle: The cavity size was normal. There was mild concentric hypertrophy. Systolic function was moderately to severely reduced. The estimated ejection fraction was in the range of 30% to 35%. Moderate diffuse hypokinesis with no identifiable regional variations. Features are consistent with a pseudonormal left ventricular filling pattern, with concomitant abnormal relaxation and increased filling pressure (grade 2 diastolic dysfunction). No evidence of thrombus. - Left atrium: The atrium was mildly dilated.  Subjective: Feels well, eating less salt. Walked without any dizziness, chest pain, dyspnea, palpitations. No leg swelling, orthopnea.   Discharge Exam: Vitals:   12/08/17 1932 12/09/17 0459  BP: (!) 140/98 (!) 139/95  Pulse: 80 85  Resp: 19 20  Temp: 98.1 F (36.7 C) 98.3 F (36.8 C)  SpO2: 100% 98%   General: Pt is alert, awake, not in acute distress Cardiovascular: RRR, S1/S2 +, no rubs, no gallops Respiratory: CTA bilaterally, no wheezing, no rhonchi Abdominal: Soft, NT, ND, bowel sounds  + Extremities: No edema, no cyanosis  Labs: BNP (last 3 results) No results for input(s): BNP in the last 8760 hours. Basic Metabolic Panel: Recent Labs  Lab 12/07/17 0646 12/07/17 1831 12/09/17 0441  NA 141  --  135  K 4.0  --  4.5  CL 105  --  101  CO2 25  --  22  GLUCOSE 160*  --  102*  BUN 15  --  15  CREATININE 1.35* 1.25* 1.44*  CALCIUM 9.6  --  9.0   Liver Function Tests: No results for input(s): AST, ALT, ALKPHOS, BILITOT, PROT, ALBUMIN in the last 168 hours. No results for input(s): LIPASE, AMYLASE in the last 168 hours. No results for input(s): AMMONIA in the last 168 hours. CBC: Recent Labs  Lab 12/07/17 0646 12/07/17 1831 12/08/17 0003  WBC 4.1 5.9 5.7  NEUTROABS 2.6  --   --   HGB 15.6 15.1 15.2  HCT 47.2 47.8 49.0  MCV 80.8 84.0 83.9  PLT 157 169 176   Cardiac Enzymes: Recent Labs  Lab 12/07/17 0646 12/07/17 1216 12/07/17 1831 12/08/17 0003  TROPONINI <0.03 0.79* 6.38* 6.13*   BNP: Invalid input(s): POCBNP CBG: No results for input(s): GLUCAP in the last 168 hours. D-Dimer No results for input(s): DDIMER in the last 72 hours. Hgb A1c Recent Labs  12/07/17 1216  HGBA1C 6.4*   Lipid Profile Recent Labs    12/08/17 0003  CHOL 244*  HDL 42  LDLCALC 171*  TRIG 155*  CHOLHDL 5.8   Thyroid function studies Recent Labs    12/07/17 1216  TSH 0.915   Anemia work up No results for input(s): VITAMINB12, FOLATE, FERRITIN, TIBC, IRON, RETICCTPCT in the last 72 hours. Urinalysis No results found for: COLORURINE, APPEARANCEUR, LABSPEC, PHURINE, GLUCOSEU, HGBUR, BILIRUBINUR, KETONESUR, PROTEINUR, UROBILINOGEN, NITRITE, LEUKOCYTESUR  Microbiology No results found for this or any previous visit (from the past 240 hour(s)).  Time coordinating discharge: Approximately 40 minutes  Tyrone Nine, MD  [facility] 12/09/2017, 9:35 AM Pager [phone]

## 2017-12-11 ENCOUNTER — Telehealth: Payer: Self-pay

## 2017-12-11 NOTE — Telephone Encounter (Signed)
Spoke with Jeffrey Moreno and scheduled hospital f/u on 12/13/17 at 240 with Dr. Cristal Deer at [place].

## 2017-12-13 ENCOUNTER — Ambulatory Visit: Payer: BLUE CROSS/BLUE SHIELD | Admitting: Cardiology

## 2017-12-13 ENCOUNTER — Encounter: Payer: Self-pay | Admitting: Cardiology

## 2017-12-13 VITALS — BP 130/78 | HR 82 | Ht 68.0 in | Wt 240.0 lb

## 2017-12-13 DIAGNOSIS — E66812 Obesity, class 2: Secondary | ICD-10-CM

## 2017-12-13 DIAGNOSIS — E782 Mixed hyperlipidemia: Secondary | ICD-10-CM

## 2017-12-13 DIAGNOSIS — Z79899 Other long term (current) drug therapy: Secondary | ICD-10-CM

## 2017-12-13 DIAGNOSIS — I10 Essential (primary) hypertension: Secondary | ICD-10-CM

## 2017-12-13 DIAGNOSIS — I5040 Unspecified combined systolic (congestive) and diastolic (congestive) heart failure: Secondary | ICD-10-CM

## 2017-12-13 DIAGNOSIS — I428 Other cardiomyopathies: Secondary | ICD-10-CM

## 2017-12-13 DIAGNOSIS — Z6836 Body mass index (BMI) 36.0-36.9, adult: Secondary | ICD-10-CM

## 2017-12-13 DIAGNOSIS — R0683 Snoring: Secondary | ICD-10-CM

## 2017-12-13 DIAGNOSIS — Z7189 Other specified counseling: Secondary | ICD-10-CM | POA: Diagnosis not present

## 2017-12-13 DIAGNOSIS — G4730 Sleep apnea, unspecified: Secondary | ICD-10-CM

## 2017-12-13 MED ORDER — FUROSEMIDE 40 MG PO TABS: 40.0000 mg | ORAL_TABLET | Freq: Every day | ORAL | 3 refills | Status: AC

## 2017-12-13 NOTE — Patient Instructions (Addendum)
Medication Instructions: Your Physician recommend you make the following changes to your medication. Stop: Amlodipine 10 mg Start: Lasix 40 mg daily  If you gain 5 lbs in 5 days total, take one 40 mg furosemide daily every day until it returns to near normal. If you have to take for more than three days, call the office and let us know.   If you need a refill on your cardiac medications before your next appointment, please call your pharmacy.   Labwork: Your physician recommends that you return for lab work today (BMP)   Procedures/Testing: Your physician has requested that you have an echocardiogram in 3 months. Echocardiography is a painless test that uses sound waves to create images of your heart. It provides your doctor with information about the size and shape of your heart and how well your heart's chambers and valves are working. This procedure takes approximately one hour. There are no restrictions for this procedure. [facility] 1st [facility]  Your physician has recommended that you have a sleep study. This test records several body functions during sleep, including: brain activity, eye movement, oxygen and carbon dioxide blood levels, heart rate and rhythm, breathing rate and rhythm, the flow of air through your mouth and nose, snoring, body muscle movements, and chest and belly movement. [facility]   Follow-Up: Your physician wants you to follow-up in 3 months after ECHO  Special Instructions:    Thank you for choosing Heartcare at [facility]!!

## 2017-12-13 NOTE — Progress Notes (Signed)
Cardiology Office Note:    Date:  12/13/2017   ID:  Jeffrey Moreno, DOB 04/02/1970, MRN [id number]  PCP:  Donald Prose, MD  Cardiologist:  Buford Dresser, MD PhD  Referring MD: Donald Prose, MD   CC: post hospitalization follow up  History of Present Illness:    Jeffrey Moreno is a 47 y.o. male with a hx of nonischemic cardiomyopathy who is seen as a new consult at the request of Donald Prose, MD for the evaluation and management of nonischemic cardiomyopathy. He has PMH hypertension, hyperlipidemia, asthma, obesity and presented after chest discomfort the morning on 12/07/17. Troponins initially negative, rose to 0.79 prior to cath, peak >6. Cors are nearly clean, EF down, evaluating cause of NICM.  I met [name] during his recent hospitalization. He presented with a story and a trend of troponin elevations concerning for ACS. However, angiography demonstrated predominantly clean coronaries but decreased LV function. Troponins peaked at 6. Had brief episodes of PVCs and NSVT that improved on beta blocker. Diagnosis is nonischemic cardiomyopathy.  He had some dizzyness and diarrhea after discharge. His blood pressure periodically ranged from 95/70s to 130/80s. He cut back on the lisinopril with improvement. Has been tracking BP and weighing himself daily. Has gained three pounds since leaving the hospital. No edema, PND, orthopnea but does feel somewhat distended in his abdomen.  No chest pain, SOB, syncope, palpitations.   Past Medical History:  Diagnosis Date  . Asthma   . Chest pain at rest 11/2017  . Hyperlipidemia   . Hypertension     Past Surgical History:  Procedure Laterality Date  . LEFT HEART CATH AND CORONARY ANGIOGRAPHY N/A 12/07/2017   Procedure: LEFT HEART CATH AND CORONARY ANGIOGRAPHY;  Surgeon: Nelva Bush, MD;  Location: [place] [facility];  Service: Cardiovascular;  Laterality: N/A;  . NO PAST SURGERIES      Current Medications: Current  Outpatient Medications on File Prior to Visit  Medication Sig  . albuterol (PROVENTIL HFA;VENTOLIN HFA) 108 (90 Base) MCG/ACT inhaler Inhale 2 puffs into the lungs every 6 (six) hours as needed.  Marland Kitchen amLODipine (NORVASC) 10 MG tablet Take 10 mg by mouth daily.  Marland Kitchen atorvastatin (LIPITOR) 80 MG tablet Take 1 tablet (80 mg total) by mouth daily.  . carvedilol (COREG) 6.25 MG tablet Take 1 tablet (6.25 mg total) by mouth 2 (two) times daily with a meal.  . lisinopril (PRINIVIL,ZESTRIL) 20 MG tablet Take 20 mg by mouth daily.   No current facility-administered medications on file prior to visit.      Allergies:   Patient has no known allergies.   Social History   Socioeconomic History  . Marital status: Single    Spouse name: Not on file  . Number of children: Not on file  . Years of education: Not on file  . Highest education level: Not on file  Occupational History  . Occupation: Geophysicist/field seismologist  Social Needs  . Financial resource strain: Not on file  . Food insecurity:    Worry: Not on file    Inability: Not on file  . Transportation needs:    Medical: Not on file    Non-medical: Not on file  Tobacco Use  . Smoking status: Never Smoker  . Smokeless tobacco: Never Used  Substance and Sexual Activity  . Alcohol use: Yes    Comment: SOCIAL  . Drug use: Never  . Sexual activity: Not on file  Lifestyle  . Physical activity:    Days  per week: Not on file    Minutes per session: Not on file  . Stress: Not on file  Relationships  . Social connections:    Talks on phone: Not on file    Gets together: Not on file    Attends religious service: Not on file    Active member of club or organization: Not on file    Attends meetings of clubs or organizations: Not on file    Relationship status: Not on file  Other Topics Concern  . Not on file  Social History Narrative  . Not on file     Family History: The patient's family history includes Aneurysm (age of onset: 62) in his mother;  CAD in his maternal grandmother; Hypertension in his father.  ROS:   Please see the history of present illness.  Additional pertinent ROS: Review of Systems  Constitutional: Negative for chills, fever and weight loss.  HENT: Negative for ear pain and hearing loss.   Eyes: Negative for blurred vision and pain.  Respiratory: Negative for sputum production and shortness of breath.   Cardiovascular: Negative for chest pain, palpitations, orthopnea, claudication, leg swelling and PND.  Gastrointestinal: Positive for diarrhea. Negative for abdominal pain, blood in stool and melena.  Genitourinary: Negative for dysuria and hematuria.  Musculoskeletal: Negative for falls and joint pain.  Skin: Negative for rash.  Neurological: Positive for dizziness. Negative for focal weakness and loss of consciousness.  Endo/Heme/Allergies: Does not bruise/bleed easily.   EKGs/Labs/Other Studies Reviewed:    The following studies were reviewed today: Cath 12/07/17 Conclusions: 1. Mild, nonobstructive coronary artery disease with focal 30% stenosis in the mid RCA. Lesion severity may be accentuated by tortuosity. 2. Dilated left ventricle with severely reduced systolic function, consistent with nonischemic cardiomyopathy. Troponin elevation may reflect supply-demand mismatch in the setting of severely reduced LVEF or myocarditis. 3. Moderately elevated left ventricular filling pressure.  Recommendations: 1. Medical therapy for systolic heart failure due to nonischemic cardiomyopathy. Will initiate furosemide 20 mg IV daily, to be titrated based on urine output. Further escalation of evidence-based heart failure therapy per primary service. 2. Medical therapy and risk factor modification to prevent progression of CAD.  No indication for antiplatelet therapy at this time.  Echo on my read appears to be reduced EF with significantly abnormal tissue Doppler readings but no atrial dilation or significant  TR. Does appear to have some PR. Hypokinesis appears diffuse, worse at apex than base.  Final read Study Conclusions  - Left ventricle: The cavity size was normal. There was mild concentric hypertrophy. Systolic function was moderately to severely reduced. The estimated ejection fraction was in the range of 30% to 35%. Moderate diffuse hypokinesis with no identifiable regional variations. Features are consistent with a pseudonormal left ventricular filling pattern, with concomitant abnormal relaxation and increased filling pressure (grade 2 diastolic dysfunction). No evidence of thrombus. - Left atrium: The atrium was mildly dilated.  EKG:  EKG is not ordered today.  Recent Labs: 12/07/2017: TSH 0.915 12/08/2017: Hemoglobin 15.2; Platelets 176 12/09/2017: BUN 15; Creatinine, Ser 1.44; Potassium 4.5; Sodium 135  Recent Lipid Panel    Component Value Date/Time   CHOL 244 (H) 12/08/2017 0003   TRIG 155 (H) 12/08/2017 0003   HDL 42 12/08/2017 0003   CHOLHDL 5.8 12/08/2017 0003   VLDL 31 12/08/2017 0003   LDLCALC 171 (H) 12/08/2017 0003    Physical Exam:    VS:  BP 130/78   Pulse 82   Ht 5'  8" (1.727 m)   Wt 240 lb (108.9 kg)   SpO2 97%   BMI 36.49 kg/m     Wt Readings from Last 3 Encounters:  12/13/17 240 lb (108.9 kg)  12/09/17 238 lb 11.2 oz (108.3 kg)     GEN: Well nourished, well developed in no acute distress HEENT: Normal NECK: No JVD; No carotid bruits LYMPHATICS: No lymphadenopathy CARDIAC: regular rhythm, normal S1 and S2, no murmurs, rubs, gallops. Radial and DP pulses 2+ bilaterally. RESPIRATORY:  Clear to auscultation without rales, wheezing or rhonchi  ABDOMEN: Soft, non-tender, non-distended MUSCULOSKELETAL:  No edema; No deformity  SKIN: Warm and dry NEUROLOGIC:  Alert and oriented x 3 PSYCHIATRIC:  Normal affect   ASSESSMENT:    1. Essential hypertension   2. Class 2 severe obesity due to excess calories with serious comorbidity  and body mass index (BMI) of 36.0 to 36.9 in adult ([place])   3. Encounter for education about heart failure   4. Combined systolic and diastolic heart failure, unspecified HF chronicity ([place])   5. Counseling on health promotion and disease prevention   6. Nonischemic cardiomyopathy ([place])   7. Snoring   8. Medication management   9. Mixed hyperlipidemia   10. Sleep apnea, unspecified type    PLAN:    Nonischemic Cardiomyopathy See below for differential. Given acuity, pattern on echo, and description, leading thought is for a takotsubo vs. Hypertension, for reasons described below. Takostubo fits timeline of symptoms and troponins; hypertension has needed increased control. Echo with global hypokinesis, though strain patten suggests apex is slightly worse than base.  -Differential: genetic (no clear family history, but he could be de novo), hypertension driven (possible given his history, but unclear as to why he would now have an elevation in enzymes), myocarditis (fits the troponin trend and echo findings, but no symptoms of infection; normal inflammatory markers), takotsubo's (has stress, fits the troponin trend, normal cors, echo somewhat similar given that apex is worse than base), idiopathic, restrictive/infiltrative (lack of atrial change not as consistent, but tissue dopplers very diminished. Question timing/acuity). -GDMT started in hospital with carvedilol and lisinopril. No kidney/potassium room for spironolactone. -no need for aspirin -he will monitor weights. Started weight based lasix, given instructions today -repeat echo in 3 mos. If still depressed, consider entresto, ICD, referral for advanced heart failure therapies  Hypertension: Lisinopril since about 05/2017 Amlodipine for the last month -started lisinopril 40 mg, but had hypotension. Doing well on 20 mg. -discontinue amlodipine today, can uptitrate carvedilol and lisinopril as able -carvedilol for NICM as  above -continue checking home BP, bringing log with him -check BMET given changes in medications  Hyperlipidemia: Lipids show Tchol 244, TG 155, HDL 42, LDL 171. On atorvastatin ~1 month.  -Increased atorvastatin to 80 mg in the hospital. Recheck in 3 mos. While he does not have obstructive CAD at this time, with his risk factors would like to prevent progression in the future.  Obesity: -Discussed diet, exercise  Sleep apnea, suspected: noted to stop breathing while sleeping in the hospital. Snores, daytime somnolence, falls asleep easily, has hypertension, occasionally wakes up with a morning headache. -ordered sleep study  Plan for follow up: 3 months or sooner PRN, ideally with echo completed prior to 3 mos visit.  Medication Adjustments/Labs and Tests Ordered: Current medicines are reviewed at length with the patient today.  Concerns regarding medicines are outlined above.  Orders Placed This Encounter  Procedures  . Basic metabolic panel  . ECHOCARDIOGRAM COMPLETE  .  Split night study   Meds ordered this encounter  Medications  . furosemide (LASIX) 40 MG tablet    Sig: Take 1 tablet (40 mg total) by mouth daily.    Dispense:  90 tablet    Refill:  3    Patient Instructions  Medication Instructions: Your Physician recommend you make the following changes to your medication. Stop: Amlodipine 10 mg Start: Lasix 40 mg daily  If you gain 5 lbs in 5 days total, take one 40 mg furosemide daily every day until it returns to near normal. If you have to take for more than three days, call the office and let us know.   If you need a refill on your cardiac medications before your next appointment, please call your pharmacy.   Labwork: Your physician recommends that you return for lab work today (BMP)   Procedures/Testing: Your physician has requested that you have an echocardiogram in 3 months. Echocardiography is a painless test that uses sound waves to create images of  your heart. It provides your doctor with information about the size and shape of your heart and how well your heart's chambers and valves are working. This procedure takes approximately one hour. There are no restrictions for this procedure. [place] physician has recommended that you have a sleep study. This test records several body functions during sleep, including: brain activity, eye movement, oxygen and carbon dioxide blood levels, heart rate and rhythm, breathing rate and rhythm, the flow of air through your mouth and nose, snoring, body muscle movements, and chest and belly movement. [facility]   Follow-Up: Your physician wants you to follow-up in 3 months after ECHO  Special Instructions:    Thank you for choosing Heartcare at [facility]!!            Signed, Buford Dresser, MD PhD 12/13/2017 3:37 PM    [place]

## 2017-12-14 LAB — BASIC METABOLIC PANEL
BUN / CREAT RATIO: 19 (ref 9–20)
BUN: 30 mg/dL — AB (ref 6–24)
CHLORIDE: 101 mmol/L (ref 96–106)
CO2: 22 mmol/L (ref 20–29)
Calcium: 9.7 mg/dL (ref 8.7–10.2)
Creatinine, Ser: 1.56 mg/dL — ABNORMAL HIGH (ref 0.76–1.27)
GFR calc non Af Amer: 52 mL/min/{1.73_m2} — ABNORMAL LOW (ref >59)
GFR, EST AFRICAN AMERICAN: 60 mL/min/{1.73_m2} (ref >59)
GLUCOSE: 90 mg/dL (ref 65–99)
Potassium: 4.5 mmol/L (ref 3.5–5.2)
Sodium: 139 mmol/L (ref 134–144)

## 2018-02-12 ENCOUNTER — Telehealth: Payer: Self-pay

## 2018-02-12 NOTE — Telephone Encounter (Signed)
Opened in error

## 2018-03-14 ENCOUNTER — Encounter (INDEPENDENT_AMBULATORY_CARE_PROVIDER_SITE_OTHER): Payer: Self-pay

## 2018-03-14 ENCOUNTER — Telehealth: Payer: Self-pay | Admitting: Cardiology

## 2018-03-14 ENCOUNTER — Ambulatory Visit (HOSPITAL_BASED_OUTPATIENT_CLINIC_OR_DEPARTMENT_OTHER)
Admission: RE | Admit: 2018-03-14 | Discharge: 2018-03-14 | Disposition: A | Discharge status: Home / Self Care | Payer: BLUE CROSS/BLUE SHIELD | Source: Ambulatory Visit | Attending: Cardiology | Admitting: Cardiology

## 2018-03-14 DIAGNOSIS — I5040 Unspecified combined systolic (congestive) and diastolic (congestive) heart failure: Secondary | ICD-10-CM | POA: Insufficient documentation

## 2018-03-14 DIAGNOSIS — I428 Other cardiomyopathies: Secondary | ICD-10-CM

## 2018-03-14 MED ORDER — CARVEDILOL 6.25 MG PO TABS: 6.2500 mg | ORAL_TABLET | Freq: Two times a day (BID) | ORAL | 1 refills | Status: AC

## 2018-03-14 NOTE — Telephone Encounter (Signed)
°  Patient is out of medication   1. Which medications need to be refilled? (please list name of each medication and dose if known) Carvedilol 6.25mg   2. Which pharmacy/location (including street and city if local pharmacy) is medication to be sent to? [facility] on [address] [place]  3. Do they need a 30 day or 90 day supply? 60

## 2018-03-14 NOTE — Progress Notes (Signed)
  Echocardiogram 2D Echocardiogram has been performed.  Jeffrey Moreno T Josslin Sanjuan 03/14/2018, 1:40 PM

## 2018-04-20 ENCOUNTER — Ambulatory Visit: Payer: BLUE CROSS/BLUE SHIELD | Admitting: Cardiology

## 2018-05-02 ENCOUNTER — Ambulatory Visit: Payer: BLUE CROSS/BLUE SHIELD | Admitting: Cardiology

## 2018-05-17 ENCOUNTER — Ambulatory Visit (INDEPENDENT_AMBULATORY_CARE_PROVIDER_SITE_OTHER): Payer: BLUE CROSS/BLUE SHIELD | Admitting: Cardiology

## 2018-05-17 ENCOUNTER — Encounter: Payer: Self-pay | Admitting: Cardiology

## 2018-05-17 VITALS — BP 132/76 | HR 72 | Ht 68.0 in | Wt 249.2 lb

## 2018-05-17 DIAGNOSIS — R0683 Snoring: Secondary | ICD-10-CM

## 2018-05-17 DIAGNOSIS — I2510 Atherosclerotic heart disease of native coronary artery without angina pectoris: Secondary | ICD-10-CM

## 2018-05-17 DIAGNOSIS — I428 Other cardiomyopathies: Secondary | ICD-10-CM

## 2018-05-17 DIAGNOSIS — E785 Hyperlipidemia, unspecified: Secondary | ICD-10-CM | POA: Diagnosis not present

## 2018-05-17 DIAGNOSIS — I10 Essential (primary) hypertension: Secondary | ICD-10-CM

## 2018-05-17 DIAGNOSIS — Z712 Person consulting for explanation of examination or test findings: Secondary | ICD-10-CM

## 2018-05-17 DIAGNOSIS — Z7189 Other specified counseling: Secondary | ICD-10-CM

## 2018-05-17 DIAGNOSIS — Z6836 Body mass index (BMI) 36.0-36.9, adult: Secondary | ICD-10-CM

## 2018-05-17 LAB — LIPID PANEL
CHOL/HDL RATIO: 4.8 ratio (ref 0.0–5.0)
Cholesterol, Total: 230 mg/dL — ABNORMAL HIGH (ref 100–199)
HDL: 48 mg/dL (ref >39)
LDL Calculated: 166 mg/dL — ABNORMAL HIGH (ref 0–99)
Triglycerides: 82 mg/dL (ref 0–149)
VLDL Cholesterol Cal: 16 mg/dL (ref 5–40)

## 2018-05-17 NOTE — Patient Instructions (Signed)
Medication Instructions:  Your Physician recommend you continue on your current medication as directed.    If you need a refill on your cardiac medications before your next appointment, please call your pharmacy.   Lab work: Your physician recommends that you return for lab work today ( Lipid)  If you have labs (blood work) drawn today and your tests are completely normal, you will receive your results only by: Marland Kitchen MyChart Message (if you have MyChart) OR . A paper copy in the mail If you have any lab test that is abnormal or we need to change your treatment, we will call you to review the results.  Testing/Procedures: None  Follow-Up: At [facility], you and your health needs are our priority.  As part of our continuing mission to provide you with exceptional heart care, we have created designated Provider Care Teams.  These Care Teams include your primary Cardiologist (physician) and Advanced Practice Providers (APPs -  Physician Assistants and Nurse Practitioners) who all work together to provide you with the care you need, when you need it. You will need a follow up appointment in 6 months.  Please call our office 2 months in advance to schedule this appointment.  You may see Jodelle Red, MD or one of the following Advanced Practice Providers on your designated Care Team:   Theodore Demark, PA-C . Joni Reining, DNP, ANP

## 2018-05-17 NOTE — Progress Notes (Signed)
Cardiology Office Note:    Date:  05/18/2018   ID:  Jeffrey Moreno, DOB 1970/10/11, MRN [id number]  PCP:  Deatra James, MD  Cardiologist:  Jodelle Red, MD PhD  Referring MD: Deatra James, MD   CC: follow up  History of Present Illness:    Jeffrey Moreno is a 48 y.o. male with a hx of nonischemic cardiomyopathy who is seen in follow up today. I initially saw him in the hospital 11/2017, when he was diagnosied nonischemic cardiomyopathy. He has PMH hypertension, hyperlipidemia, asthma, obesity and presented after chest discomfort the morning on 12/07/17. Troponins initially negative, rose to 0.79 prior to cath, peak >6. Cors are nearly clean, EF down, evaluating cause of NICM. Had brief episodes of PVCs and NSVT that improved on beta blocker.   Today: reviewed echo, which showed improved EF to 50-55% from 30-35%. Working hard on lowering stress, moving toward vegetarian diet. Notes that some times he gets lightheaded after a hearty laugh.   Feels claustrophobic, hasn't done his sleep study yet due to this. Notes his stomach has felt tight, taking furosemide every day. Thinks the weight gain is due not to fluid. Is planning to start riding his bike soon, mild weight training as well.  Denies chest pain, shortness of breath at rest or with normal exertion. No PND, orthopnea, LE edema or unexpected weight gain. No syncope or palpitations.   Past Medical History:  Diagnosis Date  . Asthma   . Chest pain at rest 11/2017  . Hyperlipidemia   . Hypertension     Past Surgical History:  Procedure Laterality Date  . LEFT HEART CATH AND CORONARY ANGIOGRAPHY N/A 12/07/2017   Procedure: LEFT HEART CATH AND CORONARY ANGIOGRAPHY;  Surgeon: Yvonne Kendall, MD;  Location: [facility];  Service: Cardiovascular;  Laterality: N/A;  . NO PAST SURGERIES      Current Medications: Current Outpatient Medications on File Prior to Visit  Medication Sig  . albuterol (PROVENTIL  HFA;VENTOLIN HFA) 108 (90 Base) MCG/ACT inhaler Inhale 2 puffs into the lungs every 6 (six) hours as needed.  Marland Kitchen amLODipine (NORVASC) 10 MG tablet Take 10 mg by mouth daily.  Marland Kitchen atorvastatin (LIPITOR) 80 MG tablet Take 1 tablet (80 mg total) by mouth daily.  . carvedilol (COREG) 6.25 MG tablet Take 1 tablet (6.25 mg total) by mouth 2 (two) times daily with a meal.  . ezetimibe (ZETIA) 10 MG tablet Take 10 mg by mouth daily.   . furosemide (LASIX) 40 MG tablet Take 1 tablet (40 mg total) by mouth daily.  Marland Kitchen lisinopril (PRINIVIL,ZESTRIL) 10 MG tablet Take 20 mg by mouth daily.   No current facility-administered medications on file prior to visit.      Allergies:   Patient has no known allergies.   Social History   Socioeconomic History  . Marital status: Single    Spouse name: Not on file  . Number of children: Not on file  . Years of education: Not on file  . Highest education level: Not on file  Occupational History  . Occupation: Environmental manager  Social Needs  . Financial resource strain: Not on file  . Food insecurity:    Worry: Not on file    Inability: Not on file  . Transportation needs:    Medical: Not on file    Non-medical: Not on file  Tobacco Use  . Smoking status: Never Smoker  . Smokeless tobacco: Never Used  Substance and Sexual Activity  . Alcohol  use: Yes    Comment: SOCIAL  . Drug use: Never  . Sexual activity: Not on file  Lifestyle  . Physical activity:    Days per week: Not on file    Minutes per session: Not on file  . Stress: Not on file  Relationships  . Social connections:    Talks on phone: Not on file    Gets together: Not on file    Attends religious service: Not on file    Active member of club or organization: Not on file    Attends meetings of clubs or organizations: Not on file    Relationship status: Not on file  Other Topics Concern  . Not on file  Social History Narrative  . Not on file     Family History: The patient's family  history includes Aneurysm (age of onset: 43) in his mother; CAD in his maternal grandmother; Hypertension in his father.  ROS:   Please see the history of present illness.  Additional pertinent ROS: Review of Systems  Constitutional: Negative for chills, fever and weight loss.  HENT: Negative for ear pain and hearing loss.   Eyes: Negative for blurred vision and pain.  Respiratory: Negative for sputum production and shortness of breath.   Cardiovascular: Negative for chest pain, palpitations, orthopnea, claudication, leg swelling and PND.  Gastrointestinal: Negative for abdominal pain, blood in stool and melena.  Genitourinary: Negative for dysuria and hematuria.  Musculoskeletal: Negative for falls and joint pain.  Skin: Negative for rash.  Neurological: Negative for sensory change, focal weakness and loss of consciousness.  Endo/Heme/Allergies: Does not bruise/bleed easily.   EKGs/Labs/Other Studies Reviewed:    The following studies were reviewed today:  Echo 03/14/18 - Left ventricle: The cavity size was normal. Wall thickness was   increased in a pattern of mild LVH. Systolic function was normal.   The estimated ejection fraction was in the range of 50% to 55%.   Wall motion was normal; there were no regional wall motion   abnormalities.  Impressions: - 1. Preserved systolic function. Visually estimated EF is 50-55%.   Interval improvement compared to previous study. Impaired   relaxation.  Cath 12/07/17 Conclusions: 1. Mild, nonobstructive coronary artery disease with focal 30% stenosis in the mid RCA. Lesion severity may be accentuated by tortuosity. 2. Dilated left ventricle with severely reduced systolic function, consistent with nonischemic cardiomyopathy. Troponin elevation may reflect supply-demand mismatch in the setting of severely reduced LVEF or myocarditis. 3. Moderately elevated left ventricular filling pressure.  Echo 12/08/17 - Left ventricle: The cavity  size was normal. There was mild concentric hypertrophy. Systolic function was moderately to severely reduced. The estimated ejection fraction was in the range of 30% to 35%. Moderate diffuse hypokinesis with no identifiable regional variations. Features are consistent with a pseudonormal left ventricular filling pattern, with concomitant abnormal relaxation and increased filling pressure (grade 2 diastolic dysfunction). No evidence of thrombus. - Left atrium: The atrium was mildly dilated.  EKG:  EKG is personally reviewed today. ECG from 12/08/17 shows NSR, LAD, lateral TWI.  Recent Labs: 12/07/2017: TSH 0.915 12/08/2017: Hemoglobin 15.2; Platelets 176 12/13/2017: BUN 30; Creatinine, Ser 1.56; Potassium 4.5; Sodium 139  Recent Lipid Panel    Component Value Date/Time   CHOL 230 (H) 05/17/2018 0914   TRIG 82 05/17/2018 0914   HDL 48 05/17/2018 0914   CHOLHDL 4.8 05/17/2018 0914   CHOLHDL 5.8 12/08/2017 0003   VLDL 31 12/08/2017 0003   LDLCALC 166 (H) 05/17/2018  3614    Physical Exam:    VS:  BP 132/76   Pulse 72   Ht 5\' 8"  (1.727 m)   Wt 249 lb 3.2 oz (113 kg)   BMI 37.89 kg/m     Wt Readings from Last 3 Encounters:  05/17/18 249 lb 3.2 oz (113 kg)  12/13/17 240 lb (108.9 kg)  12/09/17 238 lb 11.2 oz (108.3 kg)     GEN: Well nourished, well developed in no acute distress HEENT: Normal NECK: No JVD; No carotid bruits LYMPHATICS: No lymphadenopathy CARDIAC: regular rhythm, normal S1 and S2, no murmurs, rubs, gallops. Radial and DP pulses 2+ bilaterally. RESPIRATORY:  Clear to auscultation without rales, wheezing or rhonchi  ABDOMEN: Soft, non-tender, non-distended MUSCULOSKELETAL:  No edema; No deformity  SKIN: Warm and dry NEUROLOGIC:  Alert and oriented x 3 PSYCHIATRIC:  Normal affect   ASSESSMENT:    1. Nonischemic cardiomyopathy (HCC) Chronic  2. Dyslipidemia   3. Class 2 severe obesity due to excess calories with serious comorbidity and body  mass index (BMI) of 36.0 to 36.9 in adult [facility]) Chronic  4. Essential hypertension   5. Mild CAD   6. Snoring   7. Counseling on health promotion and disease prevention    PLAN:    Nonischemic Cardiomyopathy See note from 12/13/17 for full differential and evaluation. below for differential. Given acuity, pattern on echo, and description, leading thought is for a takotsubo cardiomyopathy vs. Hypertension.  -GDMT started in hospital with carvedilol and lisinopril.  -3 mos echo with improved EF to 50-55%, no indication for ICD or entresto -has weight based lasix instructions -counseled on signs/symptoms that need immediate medical attention -no evidence of clinical heart failure on exam  Hypertension: -carvedilol, lisinoprol for NICM as above -continue checking home BP  Nonobstructive CAD: minor 30% focal RCA lesion -risk factor control (hypertension, hyperlipidemia, obesity) -continue discussing aspirin  Hyperlipidemia: Lipids show Tchol 244, TG 155, HDL 42, LDL 171. On atorvastatin ~1 month at that time. Had lipids checked in 02/2018 and LDL had actually increased to 185.   -Increased atorvastatin to 80 mg in the hospital.  -recheck lipids today-->LDL 166. Reports that he is taking as ordered, both atorvastatin 80 mg and ezetimibe 10 mg. With known CAD and prior event, will discuss PCSK9 inhibitor referral to PharmD clinic  Obesity: -Discussed diet, exercise, goal of ideal weight for heart health  Sleep apnea, suspected: noted to stop breathing while sleeping in the hospital. Snores, daytime somnolence, falls asleep easily, has hypertension, occasionally wakes up with a morning headache. -nervous about ordered sleep study as he is claustrophobic. Encouraged him to try it to at least determine if he has diagnosis of sleep apnea.  Plan for follow up: 6 mos  TIME SPENT WITH PATIENT: 25 minutes of direct patient care. More than 50% of that time was spent on coordination of care  and counseling regarding test results, medication recommendations and management.  Jodelle Red, MD, PhD [place]  [facility]   Medication Adjustments/Labs and Tests Ordered: Current medicines are reviewed at length with the patient today.  Concerns regarding medicines are outlined above.  Orders Placed This Encounter  Procedures  . Lipid panel   No orders of the defined types were placed in this encounter.   Patient Instructions  Medication Instructions:  Your Physician recommend you continue on your current medication as directed.    If you need a refill on your cardiac medications before your next appointment, please call your  pharmacy.   Lab work: Your physician recommends that you return for lab work today ( Lipid)  If you have labs (blood work) drawn today and your tests are completely normal, you will receive your results only by: Marland Kitchen. MyChart Message (if you have MyChart) OR . A paper copy in the mail If you have any lab test that is abnormal or we need to change your treatment, we will call you to review the results.  Testing/Procedures: None  Follow-Up: At [facility], you and your health needs are our priority.  As part of our continuing mission to provide you with exceptional heart care, we have created designated Provider Care Teams.  These Care Teams include your primary Cardiologist (physician) and Advanced Practice Providers (APPs -  Physician Assistants and Nurse Practitioners) who all work together to provide you with the care you need, when you need it. You will need a follow up appointment in 6 months.  Please call our office 2 months in advance to schedule this appointment.  You may see Jodelle RedBridgette Breeona Waid, MD or one of the following Advanced Practice Providers on your designated Care Team:   Theodore DemarkRhonda Barrett, PA-C . Joni ReiningKathryn Lawrence, DNP, ANP       Signed, Jodelle RedBridgette Micayla Brathwaite, MD PhD 05/18/2018 12:15 PM    [facility]

## 2018-05-18 ENCOUNTER — Encounter: Payer: Self-pay | Admitting: Cardiology

## 2018-05-18 DIAGNOSIS — I2510 Atherosclerotic heart disease of native coronary artery without angina pectoris: Secondary | ICD-10-CM | POA: Insufficient documentation

## 2018-06-14 ENCOUNTER — Ambulatory Visit: Payer: BLUE CROSS/BLUE SHIELD

## 2018-07-17 ENCOUNTER — Telehealth: Payer: Self-pay | Admitting: Pharmacist

## 2018-07-17 ENCOUNTER — Other Ambulatory Visit: Payer: Self-pay | Admitting: Pharmacist

## 2018-07-17 ENCOUNTER — Ambulatory Visit: Payer: BLUE CROSS/BLUE SHIELD

## 2018-07-17 DIAGNOSIS — E785 Hyperlipidemia, unspecified: Secondary | ICD-10-CM

## 2018-07-17 NOTE — Telephone Encounter (Signed)
Order for lipid fasting labs in Epic and copy sent to patient

## 2018-07-17 NOTE — Telephone Encounter (Signed)
**  Phone follow up**  HPI: 48yo male with previous medical history relevant for hypertension, hyperlipidemia, and non-ischemic cardiomyopathy with EF 50-55%.  Patient admits to non-compliance with previous therapy. He was taking atorvastatin 80mg  once per week or once every other week.   Started vegetarian diet about 2 months ago, report taking atorvastatin 80mg  daily, but is not taking ezetimibe 10mg .  Current Medications:  Atorvastatin 80mg  daily  Intolerances: none  LDL goal: < 100mg /dL  Diet: recently changed to vegetarian (started about 2 months ago)  Family History: High cholesterol from father (well controlled with medication)  Labs: 05/17/2018: CHO 230; TG 82; HDL 48; LDL-c 166 (atorvastatin 80mg  every other week)  Current Outpatient Medications on File Prior to Visit  Medication Sig Dispense Refill  . atorvastatin (LIPITOR) 80 MG tablet Take 1 tablet (80 mg total) by mouth daily. 30 tablet 0  . albuterol (PROVENTIL HFA;VENTOLIN HFA) 108 (90 Base) MCG/ACT inhaler Inhale 2 puffs into the lungs every 6 (six) hours as needed.  0  . amLODipine (NORVASC) 10 MG tablet Take 10 mg by mouth daily.    . carvedilol (COREG) 6.25 MG tablet Take 1 tablet (6.25 mg total) by mouth 2 (two) times daily with a meal. 180 tablet 1  . furosemide (LASIX) 40 MG tablet Take 1 tablet (40 mg total) by mouth daily. 90 tablet 3  . lisinopril (PRINIVIL,ZESTRIL) 10 MG tablet Take 20 mg by mouth daily.  0   No current facility-administered medications on file prior to visit.     No Known Allergies   Assessment and Plan: Patient will continue vegetarian diet, continue atorvastatin 80mg  daily, and will repeat fasting Lipids in 1 month.   We discussed Repatha 140mg  and Nexletol 180mg  as add on therapy if LDL-c remains above goal on next lipid panel.  Time spent: 15 minutes  Laykin Rainone Rodriguez-Guzman PharmD, BCPS, CPP [facility] [address] [place] [id number] 07/17/2018  9:18 AM

## 2018-12-06 ENCOUNTER — Telehealth: Payer: Self-pay | Admitting: *Deleted

## 2018-12-06 NOTE — Telephone Encounter (Signed)
Jeffrey Moreno is no longer in network.

## 2019-08-26 IMAGING — DX DG CHEST 2V
2 series · 2 of 2 positions shown · non-contrast
Comparison: None.

CLINICAL DATA: Dull chest pain.

EXAM:
CHEST - 2 VIEW

[chest pa]
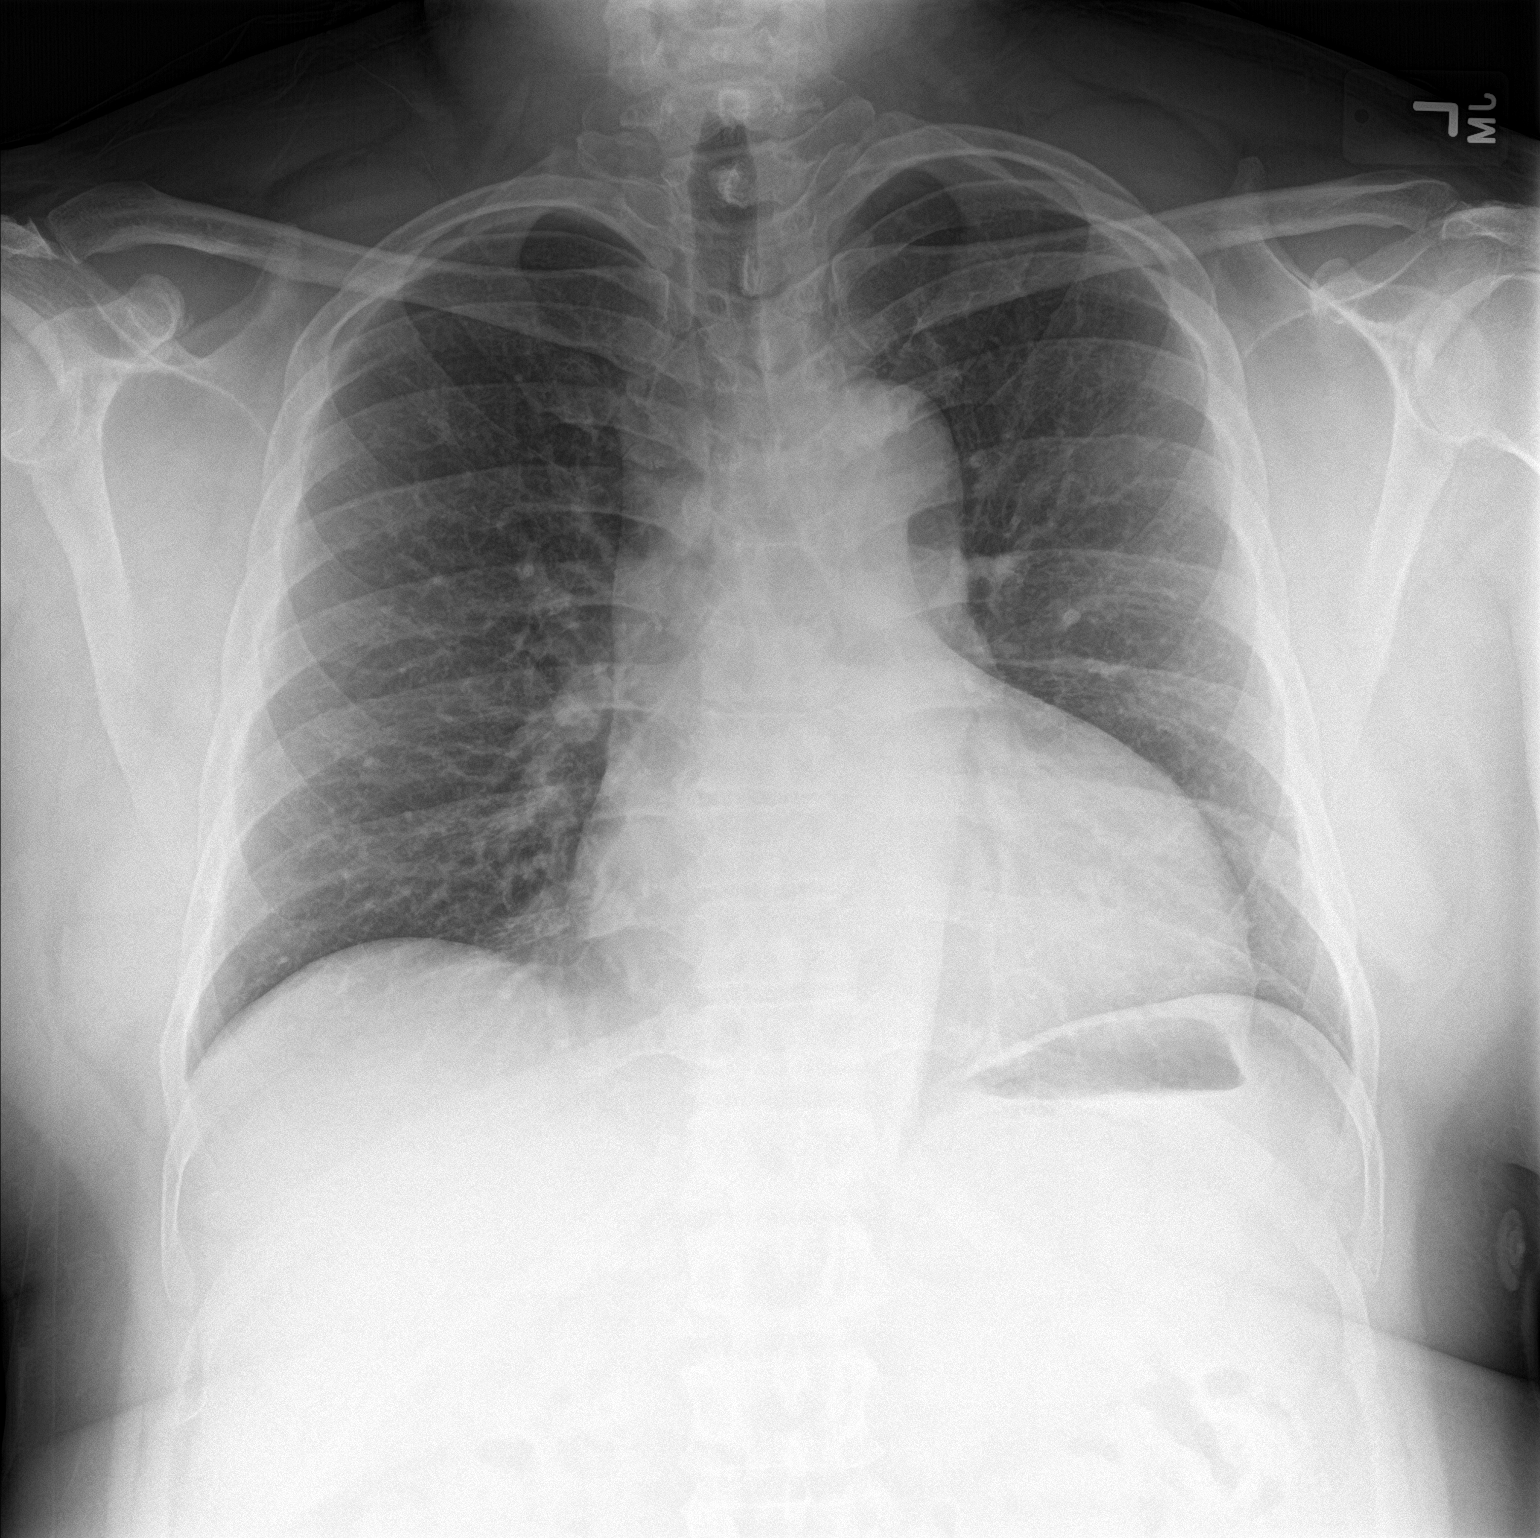

[chest lat]
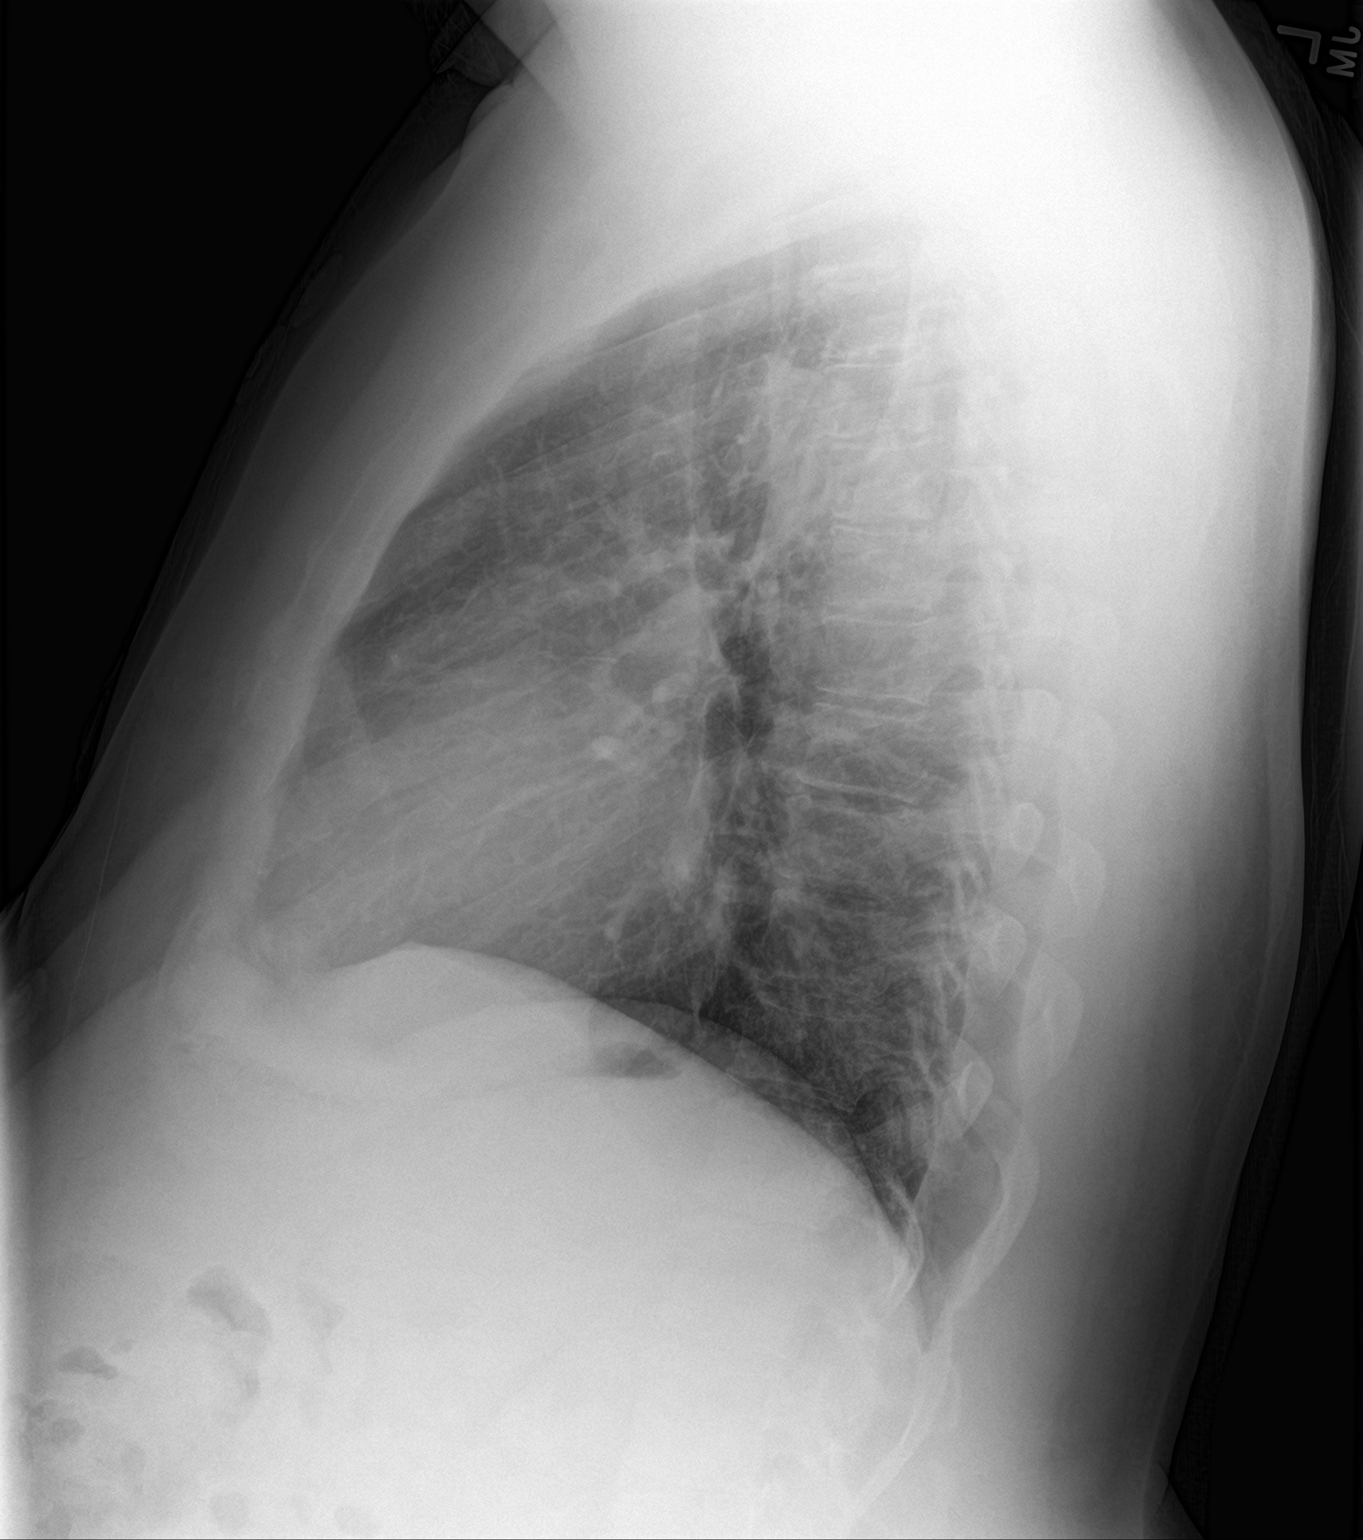

[2 of 2 positions shown; findings below may reference images not displayed]

FINDINGS: Heart is borderline in size. Lungs clear. No effusions. No acute
bony abnormality.
IMPRESSION: Borderline heart size.  No active disease.

## 2020-01-08 ENCOUNTER — Telehealth: Payer: Self-pay | Admitting: Cardiology

## 2020-01-08 NOTE — Telephone Encounter (Signed)
Patient is seeing a new cardiologist due to insurance.
